# Patient Record
Sex: Male | Born: 1960 | Race: Black or African American | Hispanic: No | Marital: Single | Smoking: Current some day smoker
Health system: Southern US, Community
[De-identification: ages and names within clinical notes are randomized; demographics above are authoritative.]

## PROBLEM LIST (undated history)

## (undated) HISTORY — PX: SHOULDER SURGERY: SHX246

---

## 2005-07-16 ENCOUNTER — Emergency Department (HOSPITAL_COMMUNITY): Admission: AD | Admit: 2005-07-16 | Discharge: 2005-07-16 | Payer: Self-pay | Admitting: Family Medicine

## 2007-08-15 ENCOUNTER — Emergency Department (HOSPITAL_COMMUNITY): Admission: EM | Admit: 2007-08-15 | Discharge: 2007-08-15 | Payer: Self-pay | Admitting: Emergency Medicine

## 2008-01-18 ENCOUNTER — Emergency Department (HOSPITAL_COMMUNITY): Admission: EM | Admit: 2008-01-18 | Discharge: 2008-01-18 | Payer: Self-pay | Admitting: Emergency Medicine

## 2012-01-22 ENCOUNTER — Encounter (HOSPITAL_COMMUNITY): Payer: Self-pay | Admitting: Emergency Medicine

## 2012-01-22 ENCOUNTER — Emergency Department (HOSPITAL_COMMUNITY)
Admission: EM | Admit: 2012-01-22 | Discharge: 2012-01-22 | Disposition: A | Discharge status: Home / Self Care | Payer: Self-pay | Attending: Emergency Medicine | Admitting: Emergency Medicine

## 2012-01-22 DIAGNOSIS — M549 Dorsalgia, unspecified: Secondary | ICD-10-CM | POA: Insufficient documentation

## 2012-01-22 DIAGNOSIS — Y9389 Activity, other specified: Secondary | ICD-10-CM | POA: Insufficient documentation

## 2012-01-22 DIAGNOSIS — S39012A Strain of muscle, fascia and tendon of lower back, initial encounter: Secondary | ICD-10-CM

## 2012-01-22 DIAGNOSIS — F17200 Nicotine dependence, unspecified, uncomplicated: Secondary | ICD-10-CM | POA: Insufficient documentation

## 2012-01-22 DIAGNOSIS — S335XXA Sprain of ligaments of lumbar spine, initial encounter: Secondary | ICD-10-CM | POA: Insufficient documentation

## 2012-01-22 DIAGNOSIS — X58XXXA Exposure to other specified factors, initial encounter: Secondary | ICD-10-CM | POA: Insufficient documentation

## 2012-01-22 DIAGNOSIS — Y929 Unspecified place or not applicable: Secondary | ICD-10-CM | POA: Insufficient documentation

## 2012-01-22 LAB — URINALYSIS, ROUTINE W REFLEX MICROSCOPIC
Bilirubin Urine: NEGATIVE
Hgb urine dipstick: NEGATIVE
Nitrite: NEGATIVE
Specific Gravity, Urine: 1.02 (ref 1.005–1.030)
Urobilinogen, UA: 0.2 mg/dL (ref 0.0–1.0)
pH: 6 (ref 5.0–8.0)

## 2012-01-22 MED ORDER — KETOROLAC TROMETHAMINE 30 MG/ML IJ SOLN
30.0000 mg | Freq: Once | INTRAMUSCULAR | Status: AC
  Administered 2012-01-22: 30 mg via INTRAMUSCULAR
  Filled 2012-01-22: qty 1

## 2012-01-22 MED ORDER — IBUPROFEN 600 MG PO TABS: 600.0000 mg | ORAL_TABLET | Freq: Four times a day (QID) | ORAL | Status: DC | PRN

## 2012-01-22 MED ORDER — DIAZEPAM 10 MG PO TABS: 5.0000 mg | ORAL_TABLET | Freq: Four times a day (QID) | ORAL | Status: DC | PRN

## 2012-01-22 MED ORDER — DIAZEPAM 5 MG PO TABS
10.0000 mg | ORAL_TABLET | Freq: Once | ORAL | Status: AC
  Administered 2012-01-22: 10 mg via ORAL
  Filled 2012-01-22: qty 2

## 2012-01-22 MED ORDER — HYDROCODONE-ACETAMINOPHEN 5-325 MG PO TABS: 1.0000 | ORAL_TABLET | Freq: Four times a day (QID) | ORAL | Status: DC | PRN

## 2012-01-22 NOTE — ED Notes (Signed)
Pt c/o left sided flank pain x 3 days into lower back

## 2012-01-22 NOTE — ED Notes (Signed)
Pt. States, " brought in a bug (bed bug) dead; don't know if I got from work or not; found in bed." Inocencio Homes, NP and supporting staff mae aware.

## 2012-01-22 NOTE — ED Provider Notes (Signed)
History     CSN: [id number]  Arrival date & time 01/22/12  1732   First MD Initiated Contact with Patient 01/22/12 2054      Chief Complaint  Patient presents with  . Flank Pain    (Consider location/radiation/quality/duration/timing/severity/associated sxs/prior treatment) HPI Comments: Patient works as a Curator, for the past 3 days has had a deep pain in the L flank area without radiation that is worse with lifting, twisting, try to come to a sitting position for laying.  Has taken Ibuprofen on 2 occasion in the past 3 days with minimal relief.  Denies dysuria, diarrhea, hematuria. Previous hx of back pain/injury   Patient is a 52 y.o. male presenting with flank pain. The history is provided by the patient.  Flank Pain This is a new problem. The current episode started in the past 7 days. The problem occurs constantly. Pertinent negatives include no abdominal pain, change in bowel habit, coughing, fever, nausea, neck pain, numbness, rash or weakness. The symptoms are aggravated by bending, exertion and twisting. He has tried NSAIDs for the symptoms. The treatment provided mild relief.    History reviewed. No pertinent past medical history.  History reviewed. No pertinent past surgical history.  History reviewed. No pertinent family history.  History  Substance Use Topics  . Smoking status: Current Every Day Smoker  . Smokeless tobacco: Not on file  . Alcohol Use: No      Review of Systems  Constitutional: Positive for activity change. Negative for fever and appetite change.  HENT: Negative for neck pain.   Eyes: Negative.   Respiratory: Negative for cough.   Gastrointestinal: Negative for nausea, abdominal pain, diarrhea and change in bowel habit.  Genitourinary: Positive for flank pain. Negative for dysuria and hematuria.  Musculoskeletal: Positive for back pain.  Skin: Negative for rash and wound.  Neurological: Negative for weakness and numbness.    Psychiatric/Behavioral: Negative.     Allergies  Review of patient's allergies indicates no known allergies.  Home Medications   Current Outpatient Rx  Name  Route  Sig  Dispense  Refill  . IBUPROFEN 200 MG PO TABS   Oral   Take 600 mg by mouth every 6 (six) hours as needed. For pain         . DIAZEPAM 10 MG PO TABS   Oral   Take 0.5 tablets (5 mg total) by mouth every 6 (six) hours as needed for anxiety.   30 tablet   0   . HYDROCODONE-ACETAMINOPHEN 5-325 MG PO TABS   Oral   Take 1 tablet by mouth every 6 (six) hours as needed for pain.   10 tablet   0   . IBUPROFEN 600 MG PO TABS   Oral   Take 1 tablet (600 mg total) by mouth every 6 (six) hours as needed for pain.   30 tablet   0     BP 131/72  Pulse 76  Temp 97.6 F (36.4 C) (Oral)  Resp 13  SpO2 94%  Physical Exam  Constitutional: He is oriented to person, place, and time. He appears well-developed and well-nourished.  HENT:  Head: Normocephalic.  Eyes: Pupils are equal, round, and reactive to light.  Neck: Normal range of motion.  Cardiovascular: Normal rate.   Pulmonary/Chest: Effort normal.  Abdominal: Soft.  Musculoskeletal: He exhibits no edema and no tenderness.       Back:  Neurological: He is alert and oriented to person, place, and time.  Skin: Skin  is dry. No rash noted.    ED Course  Procedures (including critical care time)   Labs Reviewed  URINALYSIS, ROUTINE W REFLEX MICROSCOPIC   No results found.   1. Lumbar strain       MDM  urine result reviewed negative for hematuria, after PE and exam feel this is more muscular in nature than a kidney stone   Will treat with muscle relaxer and pain control         Arman Filter, NP 01/22/12 2237

## 2012-01-22 NOTE — ED Provider Notes (Signed)
Medical screening examination/treatment/procedure(s) were performed by non-physician practitioner and as supervising physician I was immediately available for consultation/collaboration.  Valjean Ruppel R. Malu Pellegrini, MD 01/22/12 2359 

## 2016-02-13 ENCOUNTER — Emergency Department (HOSPITAL_COMMUNITY): Payer: Self-pay

## 2016-02-13 ENCOUNTER — Emergency Department (HOSPITAL_COMMUNITY)
Admission: EM | Admit: 2016-02-13 | Discharge: 2016-02-13 | Disposition: A | Discharge status: Home / Self Care | Payer: Self-pay | Attending: Physician Assistant | Admitting: Physician Assistant

## 2016-02-13 ENCOUNTER — Encounter (HOSPITAL_COMMUNITY): Payer: Self-pay | Admitting: Emergency Medicine

## 2016-02-13 DIAGNOSIS — J181 Lobar pneumonia, unspecified organism: Secondary | ICD-10-CM | POA: Insufficient documentation

## 2016-02-13 DIAGNOSIS — F17200 Nicotine dependence, unspecified, uncomplicated: Secondary | ICD-10-CM | POA: Insufficient documentation

## 2016-02-13 DIAGNOSIS — J189 Pneumonia, unspecified organism: Secondary | ICD-10-CM

## 2016-02-13 DIAGNOSIS — Z79899 Other long term (current) drug therapy: Secondary | ICD-10-CM | POA: Insufficient documentation

## 2016-02-13 LAB — CBG MONITORING, ED: GLUCOSE-CAPILLARY: 131 mg/dL — AB (ref 65–99)

## 2016-02-13 MED ORDER — AZITHROMYCIN 250 MG PO TABS: 250.0000 mg | ORAL_TABLET | Freq: Every day | ORAL | 0 refills | Status: DC

## 2016-02-13 MED ORDER — ACETAMINOPHEN 325 MG PO TABS
650.0000 mg | ORAL_TABLET | Freq: Once | ORAL | Status: AC
  Administered 2016-02-13: 650 mg via ORAL
  Filled 2016-02-13: qty 2

## 2016-02-13 MED ORDER — IBUPROFEN 800 MG PO TABS
800.0000 mg | ORAL_TABLET | Freq: Once | ORAL | Status: AC
  Administered 2016-02-13: 800 mg via ORAL
  Filled 2016-02-13: qty 1

## 2016-02-13 MED ORDER — CEFTRIAXONE SODIUM 1 G IJ SOLR
1.0000 g | Freq: Once | INTRAMUSCULAR | Status: AC
  Administered 2016-02-13: 1 g via INTRAMUSCULAR
  Filled 2016-02-13: qty 10

## 2016-02-13 MED ORDER — LIDOCAINE HCL 1 % IJ SOLN
INTRAMUSCULAR | Status: AC
  Administered 2016-02-13: 2.1 mL
  Filled 2016-02-13: qty 20

## 2016-02-13 MED ORDER — AZITHROMYCIN 250 MG PO TABS
500.0000 mg | ORAL_TABLET | Freq: Once | ORAL | Status: AC
  Administered 2016-02-13: 500 mg via ORAL
  Filled 2016-02-13: qty 2

## 2016-02-13 NOTE — ED Provider Notes (Signed)
[facility]-EMERGENCY DEPT Provider Note   CSN: [id number] Arrival date & time: 02/13/16  1502     History   Chief Complaint Chief Complaint  Patient presents with  . Cough    HPI Derek Reynolds is a 56 y.o. male.  The history is provided by the patient. No language interpreter was used.  Cough  This is a new problem. The current episode started more than 2 days ago. The problem occurs constantly. The cough is productive of sputum. The maximum temperature recorded prior to his arrival was 100 to 100.9 F. Associated symptoms include shortness of breath. He has tried nothing for the symptoms. The treatment provided no relief. He is not a smoker. His past medical history does not include bronchitis.  Pt complains of a cough fever and body aches.  Pt reports he has had pneumonia in the past and that this feels the same  History reviewed. No pertinent past medical history.  There are no active problems to display for this patient.   History reviewed. No pertinent surgical history.     Home Medications    Prior to Admission medications   Medication Sig Start Date End Date Taking? Authorizing Provider  azithromycin (ZITHROMAX) 250 MG tablet Take 1 tablet (250 mg total) by mouth daily. Take first 2 tablets together, then 1 every day until finished. 02/13/16   Elson AreasLeslie K Noella Kipnis, PA-C  diazepam (VALIUM) 10 MG tablet Take 0.5 tablets (5 mg total) by mouth every 6 (six) hours as needed for anxiety. 01/22/12   Earley FavorGail Schulz, NP  HYDROcodone-acetaminophen (NORCO/VICODIN) 5-325 MG per tablet Take 1 tablet by mouth every 6 (six) hours as needed for pain. 01/22/12   Earley FavorGail Schulz, NP  ibuprofen (ADVIL,MOTRIN) 200 MG tablet Take 600 mg by mouth every 6 (six) hours as needed. For pain    Historical Provider, MD  ibuprofen (ADVIL,MOTRIN) 600 MG tablet Take 1 tablet (600 mg total) by mouth every 6 (six) hours as needed for pain. 01/22/12   Earley FavorGail Schulz, NP    Family History No family history on file.  Social  History Social History  Substance Use Topics  . Smoking status: Current Some Day Smoker  . Smokeless tobacco: Not on file  . Alcohol use No     Allergies   Patient has no known allergies.   Review of Systems Review of Systems  Respiratory: Positive for cough and shortness of breath.   All other systems reviewed and are negative.    Physical Exam Updated Vital Signs BP 134/71 (BP Location: Left Arm)   Pulse 109   Temp 98.3 F (36.8 C) (Oral)   Resp 24   Ht 5\' 8"  (1.727 m)   Wt 81.6 kg   SpO2 98%   BMI 27.37 kg/m   Physical Exam  Constitutional: He appears well-developed and well-nourished.  HENT:  Head: Normocephalic and atraumatic.  Right Ear: External ear normal.  Left Ear: External ear normal.  Nose: Nose normal.  Mouth/Throat: Oropharynx is clear and moist.  Eyes: Conjunctivae are normal.  Neck: Neck supple.  Cardiovascular: Normal rate and regular rhythm.   No murmur heard. Pulmonary/Chest: Effort normal and breath sounds normal. No respiratory distress.  Abdominal: Soft. There is no tenderness.  Musculoskeletal: He exhibits no edema.  Neurological: He is alert.  Skin: Skin is warm and dry.  Psychiatric: He has a normal mood and affect.  Nursing note and vitals reviewed.    ED Treatments / Results  Labs (all labs ordered are listed,  but only abnormal results are displayed) Labs Reviewed - No data to display  EKG  EKG Interpretation None       Radiology Dg Chest 2 View  Result Date: 02/13/2016 CLINICAL DATA:  Cough, headache and fever. EXAM: CHEST  2 VIEW COMPARISON:  None. FINDINGS: There is developing consolidation within the left upper lobe. The remainder of the lungs is clear. There is no pleural effusion or pneumothorax. Cardiomediastinal contours are normal. IMPRESSION: Left upper lobe pneumonia. Electronically Signed   By: Deatra Robinson M.D.   On: 02/13/2016 18:24    Procedures Procedures (including critical care time)  An After  Visit Summary was printed and given to the patient.Medications Ordered in ED Medications  cefTRIAXone (ROCEPHIN) injection 1 g (not administered)  azithromycin (ZITHROMAX) tablet 500 mg (not administered)  ibuprofen (ADVIL,MOTRIN) tablet 800 mg (800 mg Oral Given 02/13/16 1817)     Initial Impression / Assessment and Plan / ED Course  I have reviewed the triage vital signs and the nursing notes.  Pertinent labs & imaging results that were available during my care of the patient were reviewed by me and considered in my medical decision making (see chart for details).     Chest xray shows pneumonia,  Pt given Rocephin and zithromax here.  Pt counseled on pneumonia.  Pt does not have a primary.  Pt advised to call to schedule complete Pe and recheck of pneumonia.  Final Clinical Impressions(s) / ED Diagnoses   Final diagnoses:  Community acquired pneumonia of left upper lobe of lung (HCC)    New Prescriptions New Prescriptions   AZITHROMYCIN (ZITHROMAX) 250 MG TABLET    Take 1 tablet (250 mg total) by mouth daily. Take first 2 tablets together, then 1 every day until finished.     Lonia Skinner Bartow, PA-C 02/13/16 2000    Courteney Randall An, MD 02/17/16 [phone]

## 2016-02-13 NOTE — ED Triage Notes (Signed)
  CBG 131  

## 2016-02-13 NOTE — ED Triage Notes (Signed)
Pt from home with complaints of cough that began on Sunday with accompanying headache. Pt states he has had a fever of 102 at home, but he is not febrile here. Pt was taking mucinex (last dose was around 1100) and theraflu with minimal relief. Pt states he is lethargic and gets winded after walking. Pt states he has been coughing up thick dark colored sputum.

## 2016-02-13 NOTE — Discharge Instructions (Signed)
See your Physician for recheck next week  °

## 2016-02-14 ENCOUNTER — Emergency Department (HOSPITAL_COMMUNITY): Payer: Self-pay

## 2016-02-14 ENCOUNTER — Encounter (HOSPITAL_COMMUNITY): Payer: Self-pay | Admitting: Emergency Medicine

## 2016-02-14 ENCOUNTER — Emergency Department (HOSPITAL_COMMUNITY)
Admission: EM | Admit: 2016-02-14 | Discharge: 2016-02-14 | Disposition: A | Discharge status: Home / Self Care | Payer: Self-pay | Attending: Emergency Medicine | Admitting: Emergency Medicine

## 2016-02-14 DIAGNOSIS — J181 Lobar pneumonia, unspecified organism: Secondary | ICD-10-CM | POA: Insufficient documentation

## 2016-02-14 DIAGNOSIS — F17200 Nicotine dependence, unspecified, uncomplicated: Secondary | ICD-10-CM | POA: Insufficient documentation

## 2016-02-14 DIAGNOSIS — Z720 Tobacco use: Secondary | ICD-10-CM

## 2016-02-14 DIAGNOSIS — Z79899 Other long term (current) drug therapy: Secondary | ICD-10-CM | POA: Insufficient documentation

## 2016-02-14 DIAGNOSIS — J189 Pneumonia, unspecified organism: Secondary | ICD-10-CM

## 2016-02-14 LAB — CBC WITH DIFFERENTIAL/PLATELET
BASOS ABS: 0 10*3/uL (ref 0.0–0.1)
Basophils Relative: 0 %
Eosinophils Absolute: 0 10*3/uL (ref 0.0–0.7)
Eosinophils Relative: 0 %
HEMATOCRIT: 33.9 % — AB (ref 39.0–52.0)
HEMOGLOBIN: 11.5 g/dL — AB (ref 13.0–17.0)
LYMPHS PCT: 22 %
Lymphs Abs: 2.9 10*3/uL (ref 0.7–4.0)
MCH: 23.6 pg — ABNORMAL LOW (ref 26.0–34.0)
MCHC: 33.9 g/dL (ref 30.0–36.0)
MCV: 69.6 fL — AB (ref 78.0–100.0)
Monocytes Absolute: 1.4 10*3/uL — ABNORMAL HIGH (ref 0.1–1.0)
Monocytes Relative: 11 %
NEUTROS ABS: 8.7 10*3/uL — AB (ref 1.7–7.7)
NEUTROS PCT: 67 %
Platelets: 186 10*3/uL (ref 150–400)
RBC: 4.87 MIL/uL (ref 4.22–5.81)
RDW: 15 % (ref 11.5–15.5)
WBC: 13.1 10*3/uL — AB (ref 4.0–10.5)

## 2016-02-14 LAB — URINALYSIS, ROUTINE W REFLEX MICROSCOPIC
BACTERIA UA: NONE SEEN
BILIRUBIN URINE: NEGATIVE
Glucose, UA: NEGATIVE mg/dL
KETONES UR: NEGATIVE mg/dL
LEUKOCYTES UA: NEGATIVE
Nitrite: NEGATIVE
PH: 5 (ref 5.0–8.0)
Protein, ur: 100 mg/dL — AB
SQUAMOUS EPITHELIAL / LPF: NONE SEEN
Specific Gravity, Urine: 1.021 (ref 1.005–1.030)

## 2016-02-14 LAB — COMPREHENSIVE METABOLIC PANEL
ALBUMIN: 3.9 g/dL (ref 3.5–5.0)
ALT: 33 U/L (ref 17–63)
ANION GAP: 10 (ref 5–15)
AST: 41 U/L (ref 15–41)
Alkaline Phosphatase: 65 U/L (ref 38–126)
BILIRUBIN TOTAL: 1 mg/dL (ref 0.3–1.2)
BUN: 13 mg/dL (ref 6–20)
CO2: 28 mmol/L (ref 22–32)
Calcium: 8.6 mg/dL — ABNORMAL LOW (ref 8.9–10.3)
Chloride: 96 mmol/L — ABNORMAL LOW (ref 101–111)
Creatinine, Ser: 1.39 mg/dL — ABNORMAL HIGH (ref 0.61–1.24)
GFR, EST NON AFRICAN AMERICAN: 55 mL/min — AB (ref >60)
GLUCOSE: 113 mg/dL — AB (ref 65–99)
POTASSIUM: 2.9 mmol/L — AB (ref 3.5–5.1)
Sodium: 134 mmol/L — ABNORMAL LOW (ref 135–145)
TOTAL PROTEIN: 8.5 g/dL — AB (ref 6.5–8.1)

## 2016-02-14 LAB — I-STAT CG4 LACTIC ACID, ED: Lactic Acid, Venous: 1.92 mmol/L (ref 0.5–1.9)

## 2016-02-14 MED ORDER — DEXTROSE 5 % IV SOLN
500.0000 mg | Freq: Once | INTRAVENOUS | Status: AC
  Administered 2016-02-14: 500 mg via INTRAVENOUS
  Filled 2016-02-14: qty 500

## 2016-02-14 MED ORDER — POTASSIUM CHLORIDE CRYS ER 20 MEQ PO TBCR
40.0000 meq | EXTENDED_RELEASE_TABLET | Freq: Once | ORAL | Status: AC
  Administered 2016-02-14: 40 meq via ORAL
  Filled 2016-02-14: qty 2

## 2016-02-14 MED ORDER — SODIUM CHLORIDE 0.9 % IV BOLUS (SEPSIS)
1000.0000 mL | Freq: Once | INTRAVENOUS | Status: AC
  Administered 2016-02-14: 1000 mL via INTRAVENOUS

## 2016-02-14 MED ORDER — SODIUM CHLORIDE 0.9 % IV BOLUS (SEPSIS): 500.0000 mL | Freq: Once | INTRAVENOUS | Status: DC

## 2016-02-14 MED ORDER — ACETAMINOPHEN 325 MG PO TABS
650.0000 mg | ORAL_TABLET | Freq: Once | ORAL | Status: AC
Start: 2016-02-14 — End: 2016-02-14
  Administered 2016-02-14: 650 mg via ORAL
  Filled 2016-02-14: qty 2

## 2016-02-14 MED ORDER — SODIUM CHLORIDE 0.9 % IV BOLUS (SEPSIS): 1000.0000 mL | Freq: Once | INTRAVENOUS | Status: DC

## 2016-02-14 MED ORDER — DEXTROSE 5 % IV SOLN
1.0000 g | Freq: Once | INTRAVENOUS | Status: AC
  Administered 2016-02-14: 1 g via INTRAVENOUS
  Filled 2016-02-14: qty 10

## 2016-02-14 NOTE — ED Triage Notes (Addendum)
Patient reports was seen here for PNA yesterday and started on antibiotics.  Patient reports having fever and SOB through night but didn't take any medications for fever.  Patient reports, "I was home alone and my fever 103 and I just couldn't be home by myself like this".

## 2016-02-14 NOTE — Discharge Instructions (Signed)
Get plenty of rest and drink a lot of fluids.  For cough, use Robitussin-DM, 4 times a day.  Fever, use Tylenol, 650 mg every 4 hours.

## 2016-02-14 NOTE — ED Provider Notes (Signed)
[facility]-EMER[facility]ENCY DEPT Provider Note   CSN: [id number] Arrival date & time: 02/14/16  4098     History   Chief Complaint Chief Complaint  Patient presents with  . Pneumonia  . Shortness of Breath    HPI Derek Reynolds is a 56 y.o. male.  He complains of an illness for 5 days with fever, cough, headache and fatigue. He feels like he has "pneumonia". Was seen yesterday and started on Zithromax, and has taken the initial 500 mg dose. Is also treated with IM Rocephin yesterday. He continues to smoke. He is getting out of breath when he walks. He denies paresthesia or focal weakness. There are no other known modifying factors.   HPI  History reviewed. No pertinent past medical history.  There are no active problems to display for this patient.   History reviewed. No pertinent surgical history.     Home Medications    Prior to Admission medications   Medication Sig Start Date End Date Taking? Authorizing Provider  azithromycin (ZITHROMAX) 250 MG tablet Take 1 tablet (250 mg total) by mouth daily. Take first 2 tablets together, then 1 every day until finished. 02/13/16  Yes Lonia Skinner Sofia, PA-C  diazepam (VALIUM) 10 MG tablet Take 0.5 tablets (5 mg total) by mouth every 6 (six) hours as needed for anxiety. Patient not taking: Reported on 02/14/2016 01/22/12   Earley Favor, NP  HYDROcodone-acetaminophen (NORCO/VICODIN) 5-325 MG per tablet Take 1 tablet by mouth every 6 (six) hours as needed for pain. Patient not taking: Reported on 02/14/2016 01/22/12   Earley Favor, NP  ibuprofen (ADVIL,MOTRIN) 600 MG tablet Take 1 tablet (600 mg total) by mouth every 6 (six) hours as needed for pain. Patient not taking: Reported on 02/14/2016 01/22/12   Earley Favor, NP    Family History No family history on file.  Social History Social History  Substance Use Topics  . Smoking status: Current Some Day Smoker  . Smokeless tobacco: Not on file  . Alcohol use No     Allergies   Patient has no known  allergies.   Review of Systems Review of Systems  All other systems reviewed and are negative.    Physical Exam Updated Vital Signs BP 138/62   Pulse 88   Temp 100.2 F (37.9 C) (Oral)   Resp 15   SpO2 98%   Physical Exam  Constitutional: He is oriented to person, place, and time. He appears well-developed and well-nourished. No distress.  HENT:  Head: Normocephalic and atraumatic.  Right Ear: External ear normal.  Left Ear: External ear normal.  Eyes: Conjunctivae and EOM are normal. Pupils are equal, round, and reactive to light.  Neck: Normal range of motion and phonation normal. Neck supple.  Cardiovascular: Normal rate, regular rhythm and normal heart sounds.   Pulmonary/Chest: Effort normal and breath sounds normal. No respiratory distress. He has no wheezes. He exhibits no bony tenderness.  Abdominal: Soft. There is no tenderness.  Musculoskeletal: Normal range of motion.  Neurological: He is alert and oriented to person, place, and time. No cranial nerve deficit or sensory deficit ( ). He exhibits normal muscle tone. Coordination normal.  No dysarthria, aphasia or nystagmus.  Skin: Skin is warm, dry and intact.  Psychiatric: He has a normal mood and affect. His behavior is normal. Judgment and thought content normal.  Nursing note and vitals reviewed.    ED Treatments / Results  Labs (all labs ordered are listed, but only abnormal results are displayed) Labs  Reviewed  COMPREHENSIVE METABOLIC PANEL - Abnormal; Notable for the following:       Result Value   Sodium 134 (*)    Potassium 2.9 (*)    Chloride 96 (*)    Glucose, Bld 113 (*)    Creatinine, Ser 1.39 (*)    Calcium 8.6 (*)    Total Protein 8.5 (*)    GFR calc non Af Amer 55 (*)    All other components within normal limits  CBC WITH DIFFERENTIAL/PLATELET - Abnormal; Notable for the following:    WBC 13.1 (*)    Hemoglobin 11.5 (*)    HCT 33.9 (*)    MCV 69.6 (*)    MCH 23.6 (*)    Neutro Abs  8.7 (*)    Monocytes Absolute 1.4 (*)    All other components within normal limits  I-STAT CG4 LACTIC ACID, ED - Abnormal; Notable for the following:    Lactic Acid, Venous 1.92 (*)    All other components within normal limits  CULTURE, BLOOD (ROUTINE X 2)  CULTURE, BLOOD (ROUTINE X 2)  URINE CULTURE  URINALYSIS, ROUTINE W REFLEX MICROSCOPIC  I-STAT CG4 LACTIC ACID, ED    EKG  EKG Interpretation None       Radiology Dg Chest 2 View  Result Date: 02/14/2016 CLINICAL DATA:  Pt states his body started aching on Monday with fever and SOB. Symptoms got worse on Tuesday. Found to have fluid on left lung on Wednesday. Loss of sleep. Increase in body weakness. Head ahces. Current smoker. Coughing up blood. EXAM: CHEST  2 VIEW COMPARISON:  Chest x-ray dated 02/13/2016. FINDINGS: Ill-defined consolidation within the left upper lobe is unchanged in the short-term interval. Right lung remains clear. No pleural effusion or pneumothorax seen. Heart size is normal. Osseous structures about the chest are unremarkable. IMPRESSION: Left upper lobe airspace opacity is unchanged compared to yesterday's exam, most likely pneumonia. Pulmonary hemorrhage is a consideration given the current hemoptysis. Recommend follow-up chest x-ray to ensure resolution and exclude underlying neoplastic process. No new findings compared to yesterday's exam. Electronically Signed   By: Bary RichardStan  Maynard M.D.   On: 02/14/2016 14:51   Dg Chest 2 View  Result Date: 02/13/2016 CLINICAL DATA:  Cough, headache and fever. EXAM: CHEST  2 VIEW COMPARISON:  None. FINDINGS: There is developing consolidation within the left upper lobe. The remainder of the lungs is clear. There is no pleural effusion or pneumothorax. Cardiomediastinal contours are normal. IMPRESSION: Left upper lobe pneumonia. Electronically Signed   By: Deatra RobinsonKevin  Herman M.D.   On: 02/13/2016 18:24    Procedures Procedures (including critical care time)  Medications Ordered in  ED Medications  sodium chloride 0.9 % bolus 1,000 mL (0 mLs Intravenous Stopped 02/14/16 1751)    And  sodium chloride 0.9 % bolus 1,000 mL (not administered)    And  sodium chloride 0.9 % bolus 500 mL (not administered)  potassium chloride SA (K-DUR,KLOR-CON) CR tablet 40 mEq (not administered)  cefTRIAXone (ROCEPHIN) 1 g in dextrose 5 % 50 mL IVPB (0 g Intravenous Stopped 02/14/16 1605)  azithromycin (ZITHROMAX) 500 mg in dextrose 5 % 250 mL IVPB (500 mg Intravenous New Bag/Given 02/14/16 1607)     Initial Impression / Assessment and Plan / ED Course  I have reviewed the triage vital signs and the nursing notes.  Pertinent labs & imaging results that were available during my care of the patient were reviewed by me and considered in my medical decision making (  see chart for details).     Medications  sodium chloride 0.9 % bolus 1,000 mL (0 mLs Intravenous Stopped 02/14/16 1751)    And  sodium chloride 0.9 % bolus 1,000 mL (not administered)    And  sodium chloride 0.9 % bolus 500 mL (not administered)  potassium chloride SA (K-DUR,KLOR-CON) CR tablet 40 mEq (not administered)  cefTRIAXone (ROCEPHIN) 1 g in dextrose 5 % 50 mL IVPB (0 g Intravenous Stopped 02/14/16 1605)  azithromycin (ZITHROMAX) 500 mg in dextrose 5 % 250 mL IVPB (500 mg Intravenous New Bag/Given 02/14/16 1607)    Patient Vitals for the past 24 hrs:  BP Temp Temp src Pulse Resp SpO2  02/14/16 1745 138/62 - - 88 15 98 %  02/14/16 1730 133/62 - - 88 24 97 %  02/14/16 1715 147/86 - - 95 (!) 30 100 %  02/14/16 1700 151/78 - - 93 19 100 %  02/14/16 1645 133/86 - - 93 23 100 %  02/14/16 1630 151/80 - - 96 (!) 31 98 %  02/14/16 1615 165/78 - - 98 26 100 %  02/14/16 1545 147/74 - - 90 (!) 36 93 %  02/14/16 1203 117/69 - - 99 18 96 %  02/14/16 0854 136/82 100.2 F (37.9 C) Oral 89 26 100 %    5:52 PM Reevaluation with update and discussion. After initial assessment and treatment, an updated evaluation reveals , At this time  he is resting comfortably supine. He reaffirms that his cough is productive of sputum, and reports he has blood with it. Findings discussed with the patient and all questions were answered. Maricus Tanzi L    Final Clinical Impressions(s) / ED Diagnoses   Final diagnoses:  Community acquired pneumonia of left upper lobe of lung (HCC)  Tobacco abuse    Community-acquired pneumonia, partially treated. Doubt sepsis, metabolic instability or impending vascular collapse. A dental hypokalemia, treated in the emergency department. Is not on any potassium leaching medications, and is not vomiting. He has 4 doses of Zithromax to complete his treatment, at home currently.  Nursing Notes Reviewed/ Care Coordinated Applicable Imaging Reviewed Interpretation of Laboratory Data incorporated into ED treatment  The patient appears reasonably screened and/or stabilized for discharge and I doubt any other medical condition or other [facility] requiring further screening, evaluation, or treatment in the ED at this time prior to discharge.  Plan: Home Medications- continue; Home Treatments-stop smoking,  rest; return here if the recommended treatment, does not improve the symptoms; Recommended follow up- PCP prn  New Prescriptions New Prescriptions   No medications on file     Mancel Bale, MD 02/14/16 1755

## 2016-02-14 NOTE — ED Notes (Signed)
Ambulated to the BR with steady gait

## 2016-02-14 NOTE — ED Notes (Signed)
Pt is resting, c/o a h/a.  Pt is A&Ox 4.  No neuro deficits noted.

## 2016-02-14 NOTE — ED Notes (Addendum)
Pt placed on O2 2L [place]. Sats on RA 93%

## 2016-02-16 LAB — URINE CULTURE: CULTURE: NO GROWTH

## 2016-02-19 LAB — CULTURE, BLOOD (ROUTINE X 2)
CULTURE: NO GROWTH
Culture: NO GROWTH

## 2017-06-19 DIAGNOSIS — F17200 Nicotine dependence, unspecified, uncomplicated: Secondary | ICD-10-CM | POA: Insufficient documentation

## 2017-06-19 DIAGNOSIS — M545 Low back pain: Secondary | ICD-10-CM | POA: Insufficient documentation

## 2017-06-20 ENCOUNTER — Emergency Department (HOSPITAL_COMMUNITY)
Admission: EM | Admit: 2017-06-20 | Discharge: 2017-06-20 | Disposition: A | Discharge status: Home / Self Care | Payer: Self-pay | Attending: Emergency Medicine | Admitting: Emergency Medicine

## 2017-06-20 ENCOUNTER — Encounter (HOSPITAL_COMMUNITY): Payer: Self-pay | Admitting: Emergency Medicine

## 2017-06-20 ENCOUNTER — Other Ambulatory Visit: Payer: Self-pay

## 2017-06-20 DIAGNOSIS — M545 Low back pain, unspecified: Secondary | ICD-10-CM

## 2017-06-20 LAB — URINALYSIS, ROUTINE W REFLEX MICROSCOPIC
BILIRUBIN URINE: NEGATIVE
Glucose, UA: NEGATIVE mg/dL
HGB URINE DIPSTICK: NEGATIVE
KETONES UR: NEGATIVE mg/dL
Leukocytes, UA: NEGATIVE
Nitrite: NEGATIVE
PROTEIN: NEGATIVE mg/dL
Specific Gravity, Urine: 1.023 (ref 1.005–1.030)
pH: 5 (ref 5.0–8.0)

## 2017-06-20 MED ORDER — HYDROCODONE-ACETAMINOPHEN 5-325 MG PO TABS: 1.0000 | ORAL_TABLET | Freq: Four times a day (QID) | ORAL | 0 refills | Status: DC | PRN

## 2017-06-20 MED ORDER — CYCLOBENZAPRINE HCL 10 MG PO TABS: 10.0000 mg | ORAL_TABLET | Freq: Three times a day (TID) | ORAL | 0 refills | Status: DC | PRN

## 2017-06-20 NOTE — ED Notes (Signed)
Pt reports having lower left back pain that has been ongoing for the last 8 months but has worsened over the last several days.

## 2017-06-20 NOTE — ED Provider Notes (Signed)
[facility] Provider Note: Derek DellJ. Lane Kingstin Heims, MD, FACEP  CSN: [id number] MRN: [id number] ARRIVAL: 06/19/17 at 2356 ROOM: WA13/WA13   CHIEF COMPLAINT  Back Pain   HISTORY OF PRESENT ILLNESS  06/20/17 2:21 AM Derek Reynolds is a 57 y.o. male who was lifting heavy objects at work 4 days ago when he felt the pain in his left lower back.  Since that time he has had persistent pain in the left lower back.  It is dull and well localized.  It is fairly constant but worse with certain positions, notably bending or trying to lie in bed.  There is no radiation to the leg.  There is no associated numbness or weakness.  There is no bowel or bladder changes.  Pain is moderate to severe at its worst.  He has been taking Aleve without adequate relief.   History reviewed. No pertinent past medical history.  History reviewed. No pertinent surgical history.  History reviewed. No pertinent family history.  Social History   Tobacco Use  . Smoking status: Current Some Day Smoker  . Smokeless tobacco: Never Used  Substance Use Topics  . Alcohol use: No  . Drug use: No    Prior to Admission medications   Medication Sig Start Date End Date Taking? Authorizing Provider  azithromycin (ZITHROMAX) 250 MG tablet Take 1 tablet (250 mg total) by mouth daily. Take first 2 tablets together, then 1 every day until finished. 02/13/16   Elson AreasSofia, Leslie K, PA-C  diazepam (VALIUM) 10 MG tablet Take 0.5 tablets (5 mg total) by mouth every 6 (six) hours as needed for anxiety. Patient not taking: Reported on 02/14/2016 01/22/12   Earley FavorSchulz, Gail, NP  HYDROcodone-acetaminophen (NORCO/VICODIN) 5-325 MG per tablet Take 1 tablet by mouth every 6 (six) hours as needed for pain. Patient not taking: Reported on 02/14/2016 01/22/12   Earley FavorSchulz, Gail, NP  ibuprofen (ADVIL,MOTRIN) 600 MG tablet Take 1 tablet (600 mg total) by mouth every 6 (six) hours as needed for pain. Patient not taking: Reported on 02/14/2016 01/22/12   Earley FavorSchulz, Gail,  NP    Allergies Patient has no known allergies.   REVIEW OF SYSTEMS  Negative except as noted here or in the History of Present Illness.   PHYSICAL EXAMINATION  Initial Vital Signs Blood pressure (!) 149/88, pulse 72, temperature 98.1 F (36.7 C), temperature source Oral, resp. rate 20, height 5\' 8"  (1.727 m), weight 83.9 kg (185 lb), SpO2 98 %.  Examination General: Well-developed, well-nourished male in no acute distress; appearance consistent with age of record HENT: normocephalic; atraumatic Eyes: pupils equal, round and reactive to light; extraocular muscles intact Neck: supple Heart: regular rate and rhythm Lungs: clear to auscultation bilaterally Abdomen: soft; nondistended; nontender; bowel sounds present Back: Nontender; pain on movement of lower back; negative straight leg raise bilaterally Extremities: No deformity; full range of motion; pulses normal Neurologic: Awake, alert and oriented; motor function intact in all extremities and symmetric; sensation intact in lower extremities and symmetric; no facial droop Skin: Warm and dry Psychiatric: Normal mood and affect   RESULTS  Summary of this visit's results, reviewed by myself:   EKG Interpretation  Date/Time:    Ventricular Rate:    PR Interval:    QRS Duration:   QT Interval:    QTC Calculation:   R Axis:     Text Interpretation:        Laboratory Studies: Results for orders placed or performed during the hospital encounter of 06/20/17 (from the past  24 hour(s))  Urinalysis, Routine w reflex microscopic- may I&O cath if menses     Status: None   Collection Time: 06/20/17  1:41 AM  Result Value Ref Range   Color, Urine YELLOW YELLOW   APPearance CLEAR CLEAR   Specific Gravity, Urine 1.023 1.005 - 1.030   pH 5.0 5.0 - 8.0   Glucose, UA NEGATIVE NEGATIVE mg/dL   Hgb urine dipstick NEGATIVE NEGATIVE   Bilirubin Urine NEGATIVE NEGATIVE   Ketones, ur NEGATIVE NEGATIVE mg/dL   Protein, ur NEGATIVE  NEGATIVE mg/dL   Nitrite NEGATIVE NEGATIVE   Leukocytes, UA NEGATIVE NEGATIVE   Imaging Studies: No results found.  ED COURSE and MDM  Nursing notes and initial vitals signs, including pulse oximetry, reviewed.  Vitals:   06/20/17 0054 06/20/17 0055  BP: (!) 149/88   Pulse: 72   Resp: 20   Temp: 98.1 F (36.7 C)   TempSrc: Oral   SpO2: 98%   Weight:  83.9 kg (185 lb)  Height:  5\' 8"  (1.727 m)   There is no evidence of acute neurologic injury on physical exam.  Symptoms and examination are consistent with acute musculoskeletal pain.    PROCEDURES    ED DIAGNOSES     ICD-10-CM   1. Acute left-sided low back pain without sciatica M54.5        Doloris Servantes, Jonny Ruiz, MD 06/20/17 [phone]

## 2017-06-20 NOTE — ED Triage Notes (Signed)
Patient complaining of left mid back pain and left flank pain. Patient thinks he pulled something in his back at work on Thursday. Patient states it has been swollen on that side also and thinks that it could be a kidney stone.

## 2017-07-15 ENCOUNTER — Ambulatory Visit (HOSPITAL_COMMUNITY)
Admission: EM | Admit: 2017-07-15 | Discharge: 2017-07-15 | Disposition: A | Discharge status: Home / Self Care | Payer: PRIVATE HEALTH INSURANCE | Attending: Family Medicine | Admitting: Family Medicine

## 2017-07-15 ENCOUNTER — Other Ambulatory Visit: Payer: Self-pay

## 2017-07-15 ENCOUNTER — Encounter (HOSPITAL_COMMUNITY): Payer: Self-pay

## 2017-07-15 DIAGNOSIS — M545 Low back pain, unspecified: Secondary | ICD-10-CM

## 2017-07-15 MED ORDER — PREDNISONE 20 MG PO TABS: 40.0000 mg | ORAL_TABLET | Freq: Every day | ORAL | 0 refills | Status: AC

## 2017-07-15 MED ORDER — CYCLOBENZAPRINE HCL 5 MG PO TABS: 5.0000 mg | ORAL_TABLET | Freq: Three times a day (TID) | ORAL | 0 refills | Status: DC | PRN

## 2017-07-15 NOTE — Discharge Instructions (Signed)
5 days of prednisone. Once completed may restart aleve, twice a day, take with food, for another 7 days if needed. Flexeril as needed for spasms or tightness, may cause drowsiness. Light and regular activity. Limit lifting to <25 lbs until symptoms improve. If symptoms worsen or do not improve in the next week to return to be seen or to follow up with your PCP.

## 2017-07-15 NOTE — ED Provider Notes (Signed)
[facility]    CSN: [id number] Arrival date & time: 07/15/17  1928     History   Chief Complaint Chief Complaint  Patient presents with  . Back Pain    HPI Derek Reynolds is a 57 y.o. male.   Derek Reynolds presents with complaints of left sided mid/low back pain which started after lifting a pool table frame two days ago. Had to bend to carry it under a door way, had immediate pain following. At night the pain worsens, worsens when he wakes. Improves with activity during the day. No urinary symptoms. Pain worse with  Movements. Pain 10/10. Took aleve yesterday but has not taken any medications today. No abdominal pain. Per chart review has similar episode one month ago. Does not follow with a PCP. Without contributing medical history.     ROS per HPI.      History reviewed. No pertinent past medical history.  There are no active problems to display for this patient.   History reviewed. No pertinent surgical history.     Home Medications    Prior to Admission medications   Medication Sig Start Date End Date Taking? Authorizing Provider  cyclobenzaprine (FLEXERIL) 5 MG tablet Take 1 tablet (5 mg total) by mouth 3 (three) times daily as needed for muscle spasms. 07/15/17   Georgetta HaberBurky, Natalie B, NP  predniSONE (DELTASONE) 20 MG tablet Take 2 tablets (40 mg total) by mouth daily with breakfast for 5 days. 07/15/17 07/20/17  Georgetta HaberBurky, Natalie B, NP    Family History History reviewed. No pertinent family history.  Social History Social History   Tobacco Use  . Smoking status: Current Some Day Smoker  . Smokeless tobacco: Never Used  Substance Use Topics  . Alcohol use: No  . Drug use: No     Allergies   Patient has no known allergies.   Review of Systems Review of Systems   Physical Exam Triage Vital Signs ED Triage Vitals  Enc Vitals Group     BP 07/15/17 2012 138/82     Pulse Rate 07/15/17 2012 74     Resp 07/15/17 2012 17     Temp 07/15/17 2012 98.2  F (36.8 C)     Temp Source 07/15/17 2012 Oral     SpO2 07/15/17 2012 100 %     Weight --      Height --      Head Circumference --      Peak Flow --      Pain Score 07/15/17 2014 10     Pain Loc --      Pain Edu? --      Excl. in GC? --    No data found.  Updated Vital Signs BP 138/82 (BP Location: Left Arm)   Pulse 74   Temp 98.2 F (36.8 C) (Oral)   Resp 17   SpO2 100%   Visual Acuity Right Eye Distance:   Left Eye Distance:   Bilateral Distance:    Right Eye Near:   Left Eye Near:    Bilateral Near:     Physical Exam  Constitutional: He is oriented to person, place, and time. He appears well-developed and well-nourished.  Cardiovascular: Normal rate and regular rhythm.  Pulmonary/Chest: Effort normal and breath sounds normal.  Abdominal: There is no tenderness.  Musculoskeletal:       Lumbar back: He exhibits tenderness and pain. He exhibits no bony tenderness and no swelling.       Back:  Pain  with back flexion and extension; ambulatory, moving all extremities; sensation intact  Neurological: He is alert and oriented to person, place, and time.  Skin: Skin is warm and dry.     UC Treatments / Results  Labs (all labs ordered are listed, but only abnormal results are displayed) Labs Reviewed - No data to display  EKG None  Radiology No results found.  Procedures Procedures (including critical care time)  Medications Ordered in UC Medications - No data to display  Initial Impression / Assessment and Plan / UC Course  I have reviewed the triage vital signs and the nursing notes.  Pertinent labs & imaging results that were available during my care of the patient were reviewed by me and considered in my medical decision making (see chart for details).     History and physical consistent with muscular strain. Prednisone pack, flexeril prn. Limit lifting. Encouraged establish with a PCP for long term management if needed. Return precautions  provided. Patient verbalized understanding and agreeable to plan.  Ambulatory out of clinic without difficulty.   Final Clinical Impressions(s) / UC Diagnoses   Final diagnoses:  Acute left-sided low back pain without sciatica     Discharge Instructions     5 days of prednisone. Once completed may restart aleve, twice a day, take with food, for another 7 days if needed. Flexeril as needed for spasms or tightness, may cause drowsiness. Light and regular activity. Limit lifting to <25 lbs until symptoms improve. If symptoms worsen or do not improve in the next week to return to be seen or to follow up with your PCP.     ED Prescriptions    Medication Sig Dispense Auth. Provider   predniSONE (DELTASONE) 20 MG tablet Take 2 tablets (40 mg total) by mouth daily with breakfast for 5 days. 10 tablet Linus Mako B, NP   cyclobenzaprine (FLEXERIL) 5 MG tablet Take 1 tablet (5 mg total) by mouth 3 (three) times daily as needed for muscle spasms. 15 tablet Georgetta Haber, NP     Controlled Substance Prescriptions [place] Controlled Substance Registry consulted? Not Applicable   Georgetta Haber, NP 07/15/17 2051

## 2017-07-15 NOTE — ED Triage Notes (Signed)
[facility], pt states he was lifting at work and hurt his back

## 2017-07-27 ENCOUNTER — Emergency Department (HOSPITAL_COMMUNITY): Payer: PRIVATE HEALTH INSURANCE

## 2017-07-27 ENCOUNTER — Emergency Department (HOSPITAL_COMMUNITY)
Admission: EM | Admit: 2017-07-27 | Discharge: 2017-07-27 | Disposition: A | Discharge status: Home / Self Care | Payer: PRIVATE HEALTH INSURANCE | Attending: Emergency Medicine | Admitting: Emergency Medicine

## 2017-07-27 ENCOUNTER — Encounter (HOSPITAL_COMMUNITY): Payer: Self-pay | Admitting: Emergency Medicine

## 2017-07-27 ENCOUNTER — Other Ambulatory Visit: Payer: Self-pay

## 2017-07-27 DIAGNOSIS — M545 Low back pain, unspecified: Secondary | ICD-10-CM

## 2017-07-27 DIAGNOSIS — M549 Dorsalgia, unspecified: Secondary | ICD-10-CM

## 2017-07-27 DIAGNOSIS — F17200 Nicotine dependence, unspecified, uncomplicated: Secondary | ICD-10-CM | POA: Diagnosis not present

## 2017-07-27 DIAGNOSIS — M47816 Spondylosis without myelopathy or radiculopathy, lumbar region: Secondary | ICD-10-CM | POA: Diagnosis not present

## 2017-07-27 MED ORDER — OXYCODONE-ACETAMINOPHEN 5-325 MG PO TABS: 1.0000 | ORAL_TABLET | ORAL | 0 refills | Status: DC | PRN

## 2017-07-27 MED ORDER — PREDNISONE 20 MG PO TABS: ORAL_TABLET | ORAL | 0 refills | Status: DC

## 2017-07-27 MED ORDER — PREDNISONE 20 MG PO TABS
60.0000 mg | ORAL_TABLET | Freq: Once | ORAL | Status: AC
  Administered 2017-07-27: 60 mg via ORAL
  Filled 2017-07-27: qty 3

## 2017-07-27 MED ORDER — OXYCODONE-ACETAMINOPHEN 5-325 MG PO TABS
1.0000 | ORAL_TABLET | Freq: Once | ORAL | Status: AC
  Administered 2017-07-27: 1 via ORAL
  Filled 2017-07-27: qty 1

## 2017-07-27 NOTE — ED Provider Notes (Signed)
[facility] EMERGENCY DEPARTMENT Provider Note   CSN: [id number] Arrival date & time: 07/27/17  0425     History   Chief Complaint Chief Complaint  Patient presents with  . Back Pain    HPI Derek Reynolds is a 57 y.o. male.  The history is provided by the patient and medical records.  Back Pain      57 year old male here with left-sided back pain.  Reports this is been ongoing for about 2 months now.  States he is always had a little bit of pain in the left side of his back but it never bothered him too much.  Reports about 2 months ago he was moving a pool table at work and had to crouch down to go through a doorway and when he stood back up felt like something was "off" in his back.  He did not feel a pop or any other severe onset of pain.  Reports since then he has been having some issues with his back and worsening pain.  States pain is always left-sided along the mid and lower back.  States occasionally his left leg will feel like it "gives out".  He does not have any focal numbness or weakness of his legs.  No bowel or bladder incontinence.  His boss did pay for him to go see a chiropractor, had about 12 sessions of adjustments, cryotherapy, and TENS unit therapy without much change.  He has been seen in the ED and at urgent care twice recently for this and is tried prednisone, pain medication, muscle relaxers without much improvement.  States he had an x-ray at the chiropractor's office prior to his adjustments but was never told of the results.  States he has been researching online and is just concerned.  He denies any fever or chills.  No history of cancer or IV drug use.  Did recently stop smoking.  No chest pain or shortness of breath.  No urinary symptoms.  History reviewed. No pertinent past medical history.  There are no active problems to display for this patient.   History reviewed. No pertinent surgical history.      Home Medications    Prior  to Admission medications   Medication Sig Start Date End Date Taking? Authorizing Provider  cyclobenzaprine (FLEXERIL) 5 MG tablet Take 1 tablet (5 mg total) by mouth 3 (three) times daily as needed for muscle spasms. 07/15/17   Georgetta Haber, NP    Family History History reviewed. No pertinent family history.  Social History Social History   Tobacco Use  . Smoking status: Current Some Day Smoker  . Smokeless tobacco: Never Used  Substance Use Topics  . Alcohol use: No  . Drug use: No     Allergies   Patient has no known allergies.   Review of Systems Review of Systems  Musculoskeletal: Positive for back pain.  All other systems reviewed and are negative.    Physical Exam Updated Vital Signs BP (!) 158/93 (BP Location: Right Arm)   Pulse 76   Temp 98 F (36.7 C) (Oral)   Resp 14   Ht 5\' 8"  (1.727 m)   Wt 83.9 kg (185 lb)   SpO2 98%   BMI 28.13 kg/m   Physical Exam  Constitutional: He is oriented to person, place, and time. He appears well-developed and well-nourished.  HENT:  Head: Normocephalic and atraumatic.  Mouth/Throat: Oropharynx is clear and moist.  Eyes: Pupils are equal, round, and reactive  to light. Conjunctivae and EOM are normal.  Neck: Normal range of motion.  Cardiovascular: Normal rate, regular rhythm and normal heart sounds.  Pulmonary/Chest: Effort normal and breath sounds normal. No stridor. No respiratory distress.  Abdominal: Soft. Bowel sounds are normal. There is no tenderness. There is no rebound.  Musculoskeletal: Normal range of motion.  Thoracic and lumbar spine overall non-tender; negative SLR bilaterally; pain elicited when changing from sitting to standing position-- points to left mid-back; normal strength/sensation of both legs, normal gait  Neurological: He is alert and oriented to person, place, and time.  Skin: Skin is warm and dry.  Psychiatric: He has a normal mood and affect.  Nursing note and vitals  reviewed.    ED Treatments / Results  Labs (all labs ordered are listed, but only abnormal results are displayed) Labs Reviewed - No data to display  EKG None  Radiology Dg Thoracic Spine W/swimmers  Result Date: 07/27/2017 CLINICAL DATA:  Initial evaluation for back pain. EXAM: THORACIC SPINE - 3 VIEWS COMPARISON:  None. FINDINGS: There is no evidence of thoracic spine fracture. Trace levoscoliosis. Alignment otherwise normal with preservation of the normal thoracic kyphosis. No other significant bone abnormalities are identified. IMPRESSION: Negative. Electronically Signed   By: Rise Mu M.D.   On: 07/27/2017 05:44   Dg Lumbar Spine Complete  Result Date: 07/27/2017 CLINICAL DATA:  Initial evaluation for acute mid and lower back pain for 2 months. EXAM: LUMBAR SPINE - COMPLETE 4+ VIEW COMPARISON:  None. FINDINGS: There is no evidence of lumbar spine fracture. Alignment is normal. Intervertebral disc spaces are maintained. Mild facet hypertrophy at L4-5 and L5-S1. Visualized soft tissues within normal limits. Mild osteoarthritic changes about the hips bilaterally. IMPRESSION: 1. Mild facet hypertrophy at L4-5 and L5-S1. 2. Otherwise negative radiographs of the lumbar spine. Electronically Signed   By: Rise Mu M.D.   On: 07/27/2017 05:42    Procedures Procedures (including critical care time)  Medications Ordered in ED Medications - No data to display   Initial Impression / Assessment and Plan / ED Course  I have reviewed the triage vital signs and the nursing notes.  Pertinent labs & imaging results that were available during my care of the patient were reviewed by me and considered in my medical decision making (see chart for details).  57 year old male here with 2 months of left-sided mid and lower back pain.  States he is always had a little bit of issues with his back, worse after moving a pool table 2 months ago at work.  He is afebrile and nontoxic.   No focal tenderness or deformities of the thoracic or lumbar spine, but pain is elicited when changing from sitting to standing position.  Negative straight leg raise bilaterally.  Has had some adjustments by chiropractor for the past 2 months.  Will obtain screening x-rays.  X-rays with facet hypertrophy.  This may be causing some of his symptoms with subsequent nerve impingement.  He remains without any focal neurologic deficits concerning for cauda equina.  He has no red flag symptoms such as fever, history of cancer, or IV drug use.  Will treat symptomatically and refer to orthopedics for ongoing management.  Patient is currently on light duty, will continue this at work.  He understands to wear back brace to help if doing any heavy lifting.  Discussed plan with patient, he acknowledged understanding and agreed with plan of care.  Return precautions given for new or worsening symptoms.  Final  Clinical Impressions(s) / ED Diagnoses   Final diagnoses:  Back pain    ED Discharge Orders        Ordered    oxyCODONE-acetaminophen (PERCOCET) 5-325 MG tablet  Every 4 hours PRN     07/27/17 0627    predniSONE (DELTASONE) 20 MG tablet     07/27/17 0627       Garlon HatchetSanders, Donzella Carrol M, PA-C 07/27/17 [id number]    Azalia Bilisampos, Kevin, MD 07/27/17 2321

## 2017-07-27 NOTE — ED Notes (Signed)
Patient transported to X-ray 

## 2017-07-27 NOTE — ED Notes (Signed)
ED Provider at bedside. 

## 2017-07-27 NOTE — ED Triage Notes (Signed)
Pt reports ongoing thoracic back pain, seen earlier this month for same. Wants XRAY or MRI. Denies tingling, numbness, loss of bowel or bladder. States RX meds not effective for pain.

## 2017-07-27 NOTE — Discharge Instructions (Signed)
Take the prescribed medication as directed.  Can try adding in heat to your routine.  Can alternate heat, off, ice, off every 20 mins or so.   Can try sleeping on your side with pillow between your legs like we talked about to see if this can take some stress off your low back. Follow-up with orthopedics-- call for appt. Return to the ED for new or worsening symptoms.

## 2017-08-10 ENCOUNTER — Other Ambulatory Visit: Payer: Self-pay | Admitting: Orthopedic Surgery

## 2017-08-10 ENCOUNTER — Other Ambulatory Visit (HOSPITAL_COMMUNITY): Payer: Self-pay | Admitting: Orthopedic Surgery

## 2017-08-10 DIAGNOSIS — M545 Low back pain: Secondary | ICD-10-CM

## 2017-08-12 ENCOUNTER — Ambulatory Visit (HOSPITAL_COMMUNITY): Admission: RE | Admit: 2017-08-12 | Payer: PRIVATE HEALTH INSURANCE | Source: Ambulatory Visit

## 2017-08-21 ENCOUNTER — Ambulatory Visit (HOSPITAL_COMMUNITY)
Admission: RE | Admit: 2017-08-21 | Discharge: 2017-08-21 | Disposition: A | Discharge status: Home / Self Care | Payer: PRIVATE HEALTH INSURANCE | Source: Ambulatory Visit | Attending: Diagnostic Radiology | Admitting: Diagnostic Radiology

## 2017-08-21 ENCOUNTER — Other Ambulatory Visit (HOSPITAL_COMMUNITY): Payer: Self-pay | Admitting: Orthopedic Surgery

## 2017-08-21 ENCOUNTER — Ambulatory Visit (HOSPITAL_COMMUNITY)
Admission: RE | Admit: 2017-08-21 | Discharge: 2017-08-21 | Disposition: A | Discharge status: Home / Self Care | Payer: PRIVATE HEALTH INSURANCE | Source: Ambulatory Visit | Attending: Orthopedic Surgery | Admitting: Orthopedic Surgery

## 2017-08-21 DIAGNOSIS — M545 Low back pain: Secondary | ICD-10-CM

## 2017-08-21 DIAGNOSIS — M5127 Other intervertebral disc displacement, lumbosacral region: Secondary | ICD-10-CM | POA: Insufficient documentation

## 2017-08-21 DIAGNOSIS — M5136 Other intervertebral disc degeneration, lumbar region: Secondary | ICD-10-CM | POA: Insufficient documentation

## 2017-08-21 DIAGNOSIS — Z1389 Encounter for screening for other disorder: Secondary | ICD-10-CM | POA: Insufficient documentation

## 2019-01-08 IMAGING — MR MR LUMBAR SPINE W/O CM
4 of 5 series · 19 of 48 positions shown · non-contrast
Comparison: Lumbar radiographs 07/27/2017

CLINICAL DATA: Low back pain

EXAM:
MRI LUMBAR SPINE WITHOUT CONTRAST
TECHNIQUE: Multiplanar, multisequence MR imaging of the lumbar spine was
performed. No intravenous contrast was administered.

[Series 3: T1 · sagittal · 4.0mm · 0.51mm/px · 3 of 14 slices shown (1 of 2)]
[im 1/14]
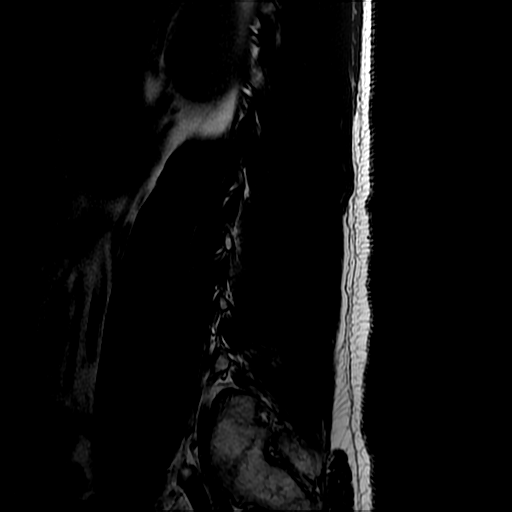
[im 7/14]
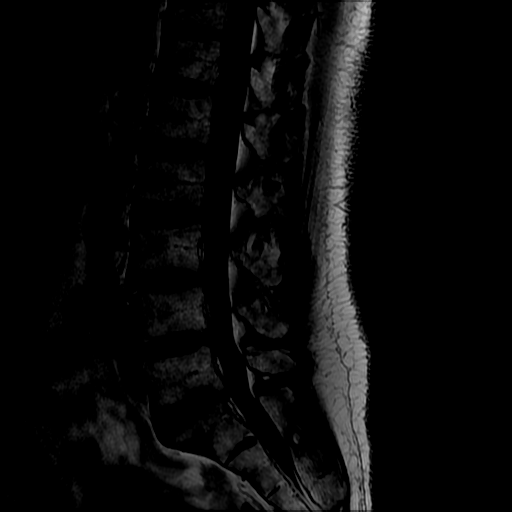
[im 14/14]
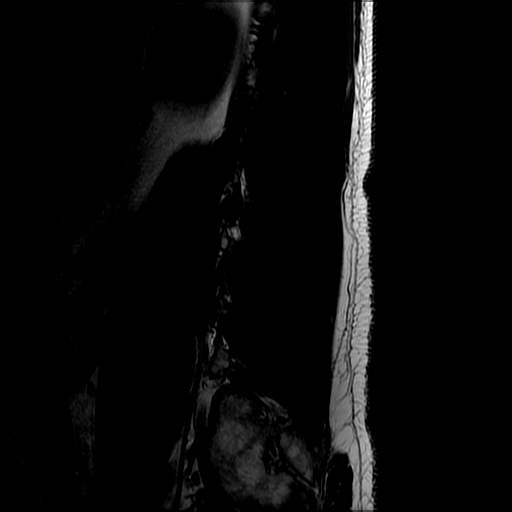

[Series 4: T2 post-contrast · sagittal · 4.0mm · 0.51mm/px · 5 of 14 slices shown]
[im 1/14]
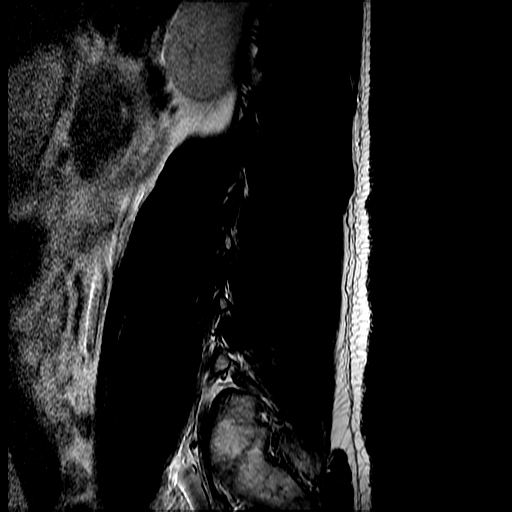
[im 4/14]
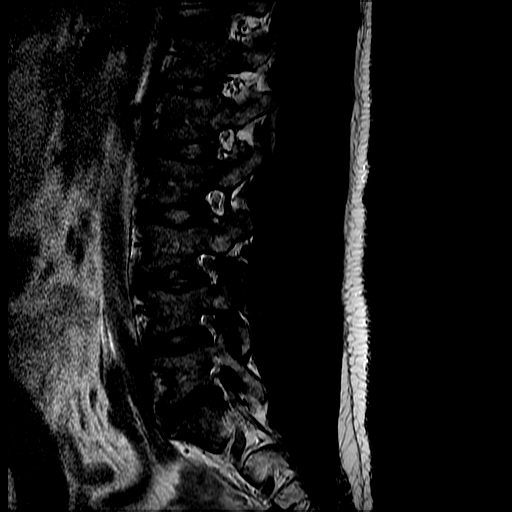
[im 7/14]
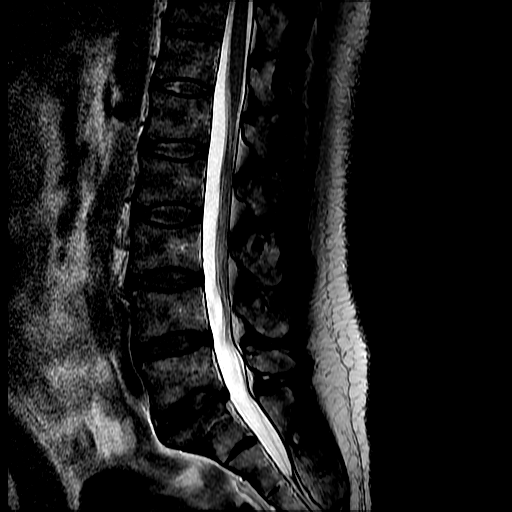
[im 10/14]
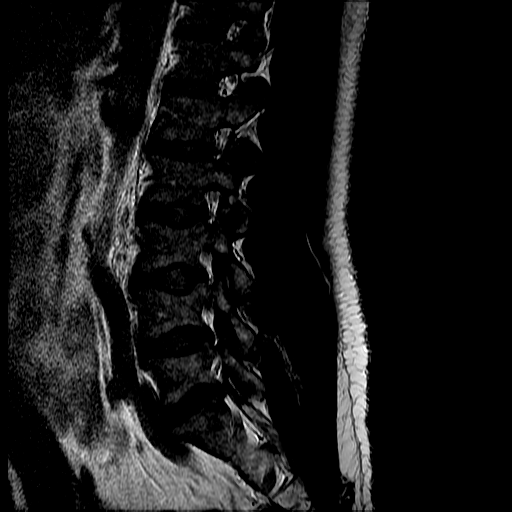
[im 14/14]
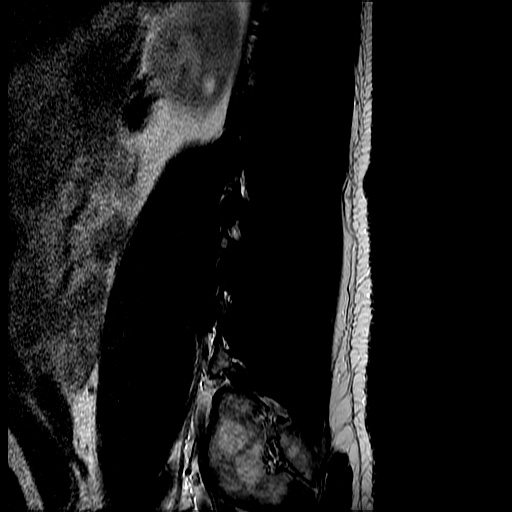

[Series 6: T2 · axial · 5.0mm · 0.39mm/px · z∈[-78,+77]mm · 8 of 40 slices shown]
[im 3/40]
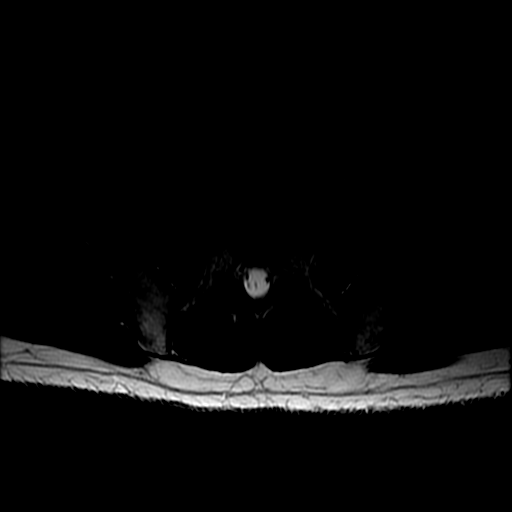
[im 6/40]
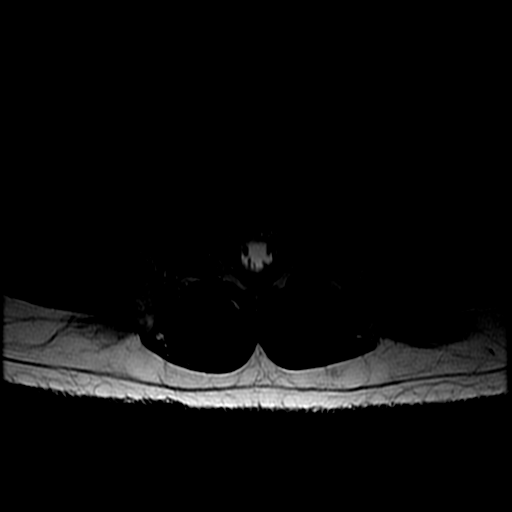
[im 8/40]
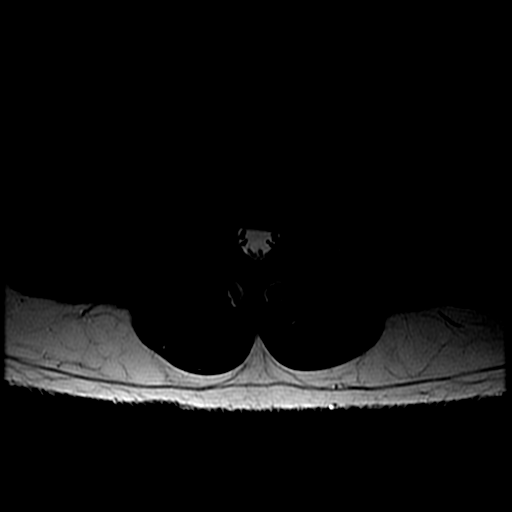
[im 14/40]
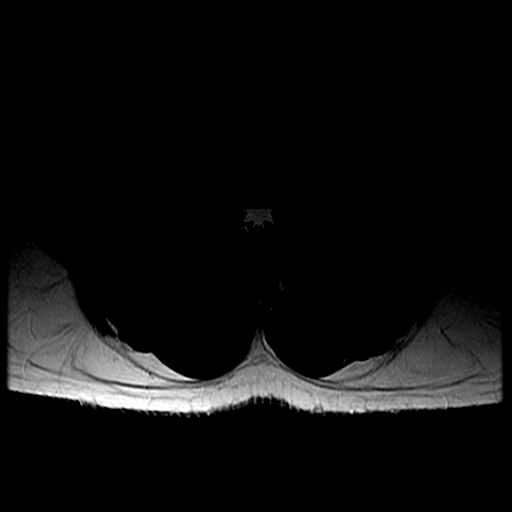
[im 19/40]
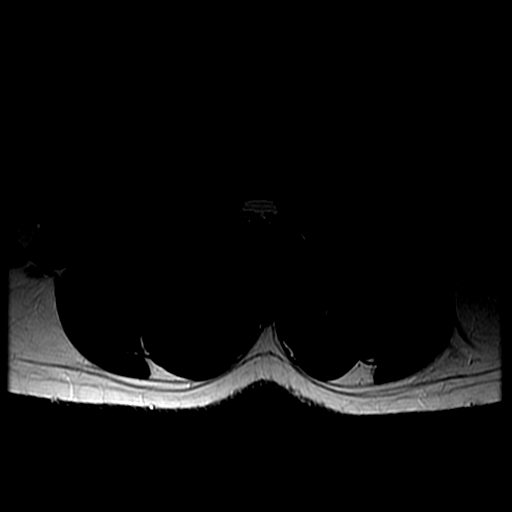
[im 21/40]
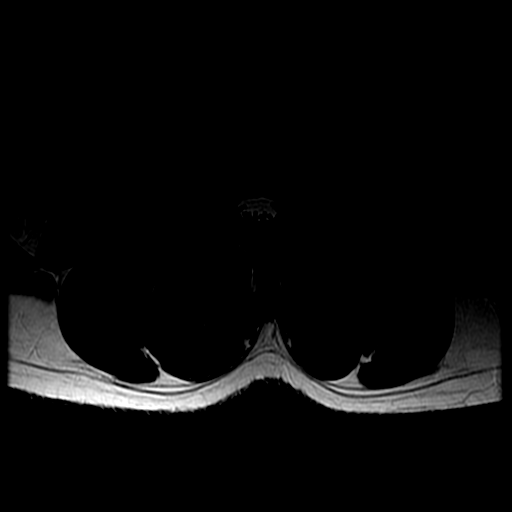
[im 24/40]
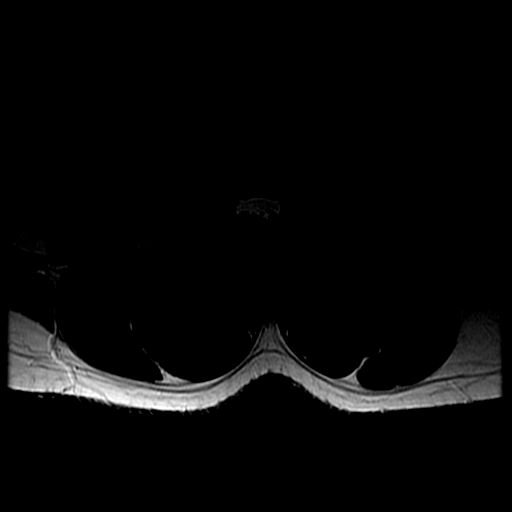
[im 34/40]
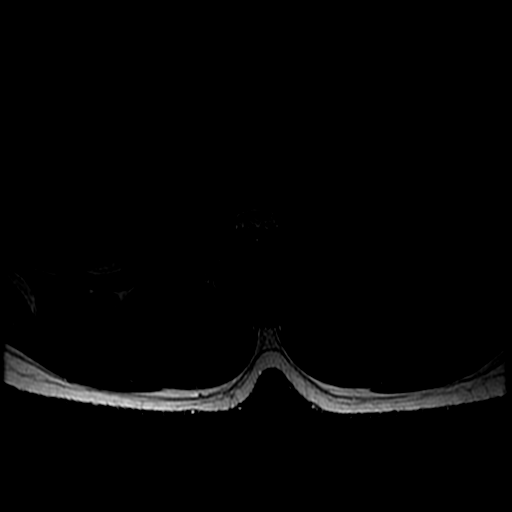

[Series 7: T1 · axial · 5.0mm · 0.39mm/px · z∈[-63,+77]mm · 3 of 40 slices shown (2 of 2)]
[im 6/40]
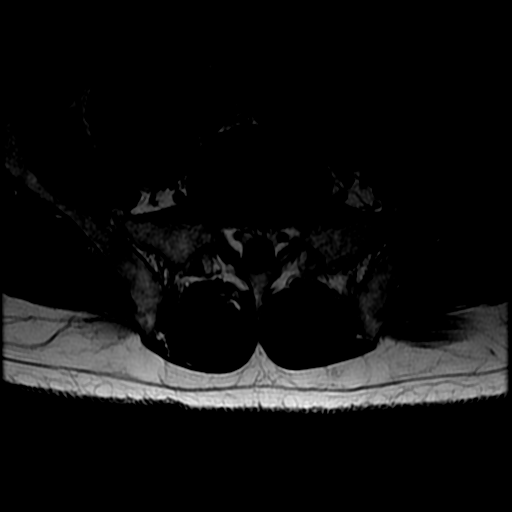
[im 21/40]
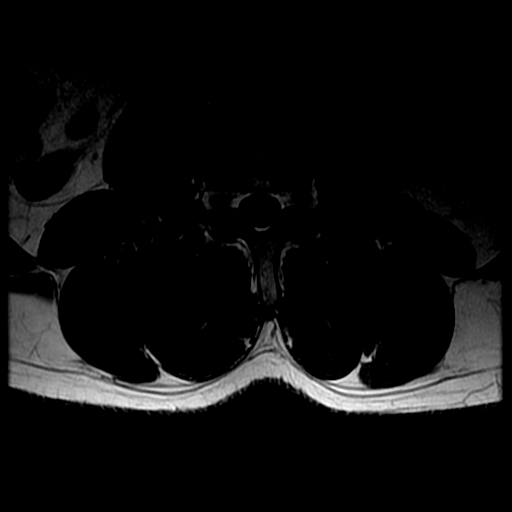
[im 34/40]
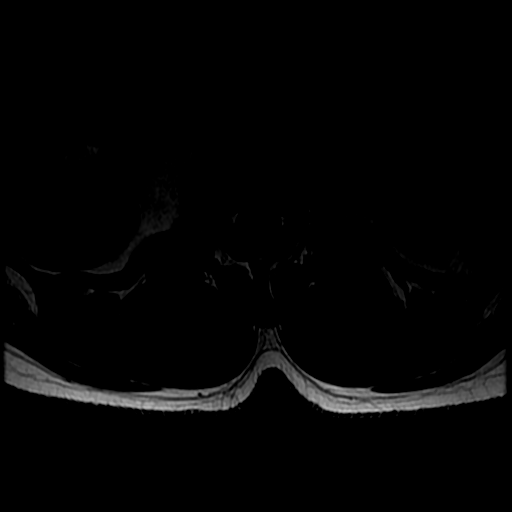

[19 of 48 positions shown; findings below may reference images not displayed]

FINDINGS: Segmentation:  Normal

Alignment:  Normal

Vertebrae:  Normal bone marrow.  Negative for fracture or mass.

Conus medullaris and cauda equina: Conus extends to the L1-2 level.
Conus and cauda equina appear normal.

Paraspinal and other soft tissues: Normal paraspinous soft tissues.

Disc levels:

L1-2: Negative

L2-3: Negative

L3-4: Small annular fissure lateral to the foramen on the left
without disc protrusion or stenosis.

L4-5: Mild disc and facet degeneration without significant stenosis.

L5-S1: Small annular fissure posteriorly on the left with small disc
protrusion. This is touching but not compressing the left S1 nerve
root. Mild facet hypertrophy and mild subarticular stenosis
bilaterally.
IMPRESSION: Small annular fissure lateral to the foramen on the left at L3-4
without significant disc protrusion or stenosis

Mild disc and facet degeneration L4-5

Small left-sided disc protrusion and annular fissure at L5-S1 which
could affect the left S1 nerve root.

## 2019-11-02 ENCOUNTER — Emergency Department (HOSPITAL_COMMUNITY)
Admission: EM | Admit: 2019-11-02 | Discharge: 2019-11-02 | Disposition: A | Discharge status: Home / Self Care | Payer: Worker's Compensation | Attending: Emergency Medicine | Admitting: Emergency Medicine

## 2019-11-02 ENCOUNTER — Emergency Department (HOSPITAL_COMMUNITY): Payer: Worker's Compensation

## 2019-11-02 ENCOUNTER — Encounter (HOSPITAL_COMMUNITY): Payer: Self-pay

## 2019-11-02 DIAGNOSIS — S4991XA Unspecified injury of right shoulder and upper arm, initial encounter: Secondary | ICD-10-CM | POA: Insufficient documentation

## 2019-11-02 DIAGNOSIS — W208XXA Other cause of strike by thrown, projected or falling object, initial encounter: Secondary | ICD-10-CM | POA: Diagnosis not present

## 2019-11-02 DIAGNOSIS — Y92810 Car as the place of occurrence of the external cause: Secondary | ICD-10-CM | POA: Insufficient documentation

## 2019-11-02 DIAGNOSIS — F17200 Nicotine dependence, unspecified, uncomplicated: Secondary | ICD-10-CM | POA: Diagnosis not present

## 2019-11-02 DIAGNOSIS — Y9389 Activity, other specified: Secondary | ICD-10-CM | POA: Insufficient documentation

## 2019-11-02 MED ORDER — METHOCARBAMOL 500 MG PO TABS: 500.0000 mg | ORAL_TABLET | Freq: Three times a day (TID) | ORAL | 0 refills | Status: DC | PRN

## 2019-11-02 MED ORDER — NAPROXEN 500 MG PO TABS
500.0000 mg | ORAL_TABLET | Freq: Once | ORAL | Status: AC
  Administered 2019-11-02: 500 mg via ORAL
  Filled 2019-11-02: qty 1

## 2019-11-02 MED ORDER — NAPROXEN 500 MG PO TABS: 500.0000 mg | ORAL_TABLET | Freq: Two times a day (BID) | ORAL | 0 refills | Status: AC | PRN

## 2019-11-02 NOTE — ED Triage Notes (Signed)
Pt states he was changing a tire and the car fell on his right shoulder and it knocked his elbow in the ground, pt states that he can't bend his right elbow and it appears to be swollen, no obvious deformity on his right shoulder but he states that its sore

## 2019-11-02 NOTE — Discharge Instructions (Addendum)
Please read and follow all provided instructions.  You have been seen today for right elbow and shoulder pain following an injury.  Tests performed today include: An x-ray of the affected areas - does NOT show any broken bones or dislocations.  Vital signs. See below for your results today.   Home care instructions: -- *PRICE in the first 24-48 hours after injury: Protect (with brace, splint, sling), if given by your provider Rest Ice- Do not apply ice pack directly to your skin, place towel or similar between your skin and ice/ice pack. Apply ice for 20 min, then remove for 40 min while awake Compression- Wear brace, elastic bandage, splint as directed by your provider Elevate affected extremity above the level of your heart when not walking around for the first 24-48 hours   With your sling please be sure to still move your shoulder some to avoid frozen shoulder syndrome. Medications:  - Naproxen is a nonsteroidal anti-inflammatory medication that will help with pain and swelling. Be sure to take this medication as prescribed with food, 1 pill every 12 hours,  It should be taken with food, as it can cause stomach upset, and more seriously, stomach bleeding. Do not take other nonsteroidal anti-inflammatory medications with this such as Advil, Motrin, Aleve, Mobic, Goodie Powder, or Motrin.    - Robaxin is the muscle relaxer I have prescribed, this is meant to help with muscle tightness. Be aware that this medication may make you drowsy therefore the first time you take this it should be at a time you are in an environment where you can rest. Do not drive or operate heavy machinery when taking this medication. Do not drink alcohol or take other sedating medications with this medicine such as narcotics or benzodiazepines.   You make take Tylenol per over the counter dosing with these medications.   We have prescribed you new medication(s) today. Discuss the medications prescribed today with  your pharmacist as they can have adverse effects and interactions with your other medicines including over the counter and prescribed medications. Seek medical evaluation if you start to experience new or abnormal symptoms after taking one of these medicines, seek care immediately if you start to experience difficulty breathing, feeling of your throat closing, facial swelling, or rash as these could be indications of a more serious allergic reaction   Follow-up instructions: Please follow-up with your primary care provider or the provided orthopedic physician (bone specialist) if you continue to have significant pain in 1 week. In this case you may have a more severe injury that requires further care.   Return instructions:  Please return if your digits or extremity are numb or tingling, appear gray or blue, or you have severe pain (also elevate the extremity and loosen splint or wrap if you were given one) Please return if you have redness or fevers.  Please return to the Emergency Department if you experience worsening symptoms.  Please return if you have any other emergent concerns. Additional Information:  Your vital signs today were: BP (!) 158/83 (BP Location: Left Arm)   Pulse 83   Temp 98.1 F (36.7 C) (Oral)   Resp 18   Ht 5\' 8"  (1.727 m)   Wt 83.9 kg   SpO2 99%   BMI 28.13 kg/m  If your blood pressure (BP) was elevated above 135/85 this visit, please have this repeated by your doctor within one month. ---------------

## 2019-11-02 NOTE — ED Provider Notes (Signed)
[facility]-EMERGENCY DEPT Provider Note   CSN: [id number] Arrival date & time: 11/02/19  0017     History Chief Complaint  Patient presents with  . Arm Injury    Derek Reynolds. is a 59 y.o. male with a hx of tobacco abuse who presents to the ED with complaints of injury to the RUE which occurred shortly PTA this evening. Patient states he was changing a tire and someone else was maneuvering the car jack, the carjack accidentally collapsed. The patient tried to move but his shoulder was hit by the car and he then fell onto his R elbow. He denies head injury or LOC. Having pain only to R shoulder with movement and to elbow. No other alleviating/aggravating factors. Denies numbness, tingling, weakness, or other areas of injury. Patient is R hand dominant.   HPI     History reviewed. No pertinent past medical history.  There are no problems to display for this patient.   History reviewed. No pertinent surgical history.     History reviewed. No pertinent family history.  Social History   Tobacco Use  . Smoking status: Current Some Day Smoker  . Smokeless tobacco: Never Used  Vaping Use  . Vaping Use: Never used  Substance Use Topics  . Alcohol use: No  . Drug use: No    Home Medications Prior to Admission medications   Medication Sig Start Date End Date Taking? Authorizing Provider  cyclobenzaprine (FLEXERIL) 5 MG tablet Take 1 tablet (5 mg total) by mouth 3 (three) times daily as needed for muscle spasms. Patient not taking: Reported on 11/02/2019 07/15/17   Linus Mako B, NP  oxyCODONE-acetaminophen (PERCOCET) 5-325 MG tablet Take 1 tablet by mouth every 4 (four) hours as needed. Patient not taking: Reported on 11/02/2019 07/27/17   Garlon Hatchet, PA-C  predniSONE (DELTASONE) 20 MG tablet Take 40 mg by mouth daily for 3 days, then 20mg  by mouth daily for 3 days, then 10mg  daily for 3 days Patient not taking: Reported on 11/02/2019  07/27/17   11/04/2019, PA-C    Allergies    Patient has no known allergies.  Review of Systems   Review of Systems  Constitutional: Negative for chills and fever.  Respiratory: Negative for shortness of breath.   Cardiovascular: Negative for chest pain.  Gastrointestinal: Negative for abdominal pain.  Musculoskeletal: Positive for arthralgias.  Skin: Negative for wound.  Neurological: Negative for weakness and numbness.  All other systems reviewed and are negative.   Physical Exam Updated Vital Signs BP (!) 158/83 (BP Location: Left Arm)   Pulse 83   Temp 98.1 F (36.7 C) (Oral)   Resp 18   Ht 5\' 8"  (1.727 m)   Wt 83.9 kg   SpO2 99%   BMI 28.13 kg/m   Physical Exam Vitals and nursing note reviewed.  Constitutional:      General: He is not in acute distress.    Appearance: Normal appearance. He is not ill-appearing or toxic-appearing.  HENT:     Head: Normocephalic and atraumatic.  Neck:     Comments: No midline tenderness.  Cardiovascular:     Rate and Rhythm: Normal rate and regular rhythm.     Pulses:          Radial pulses are 2+ on the right side and 2+ on the left side.  Pulmonary:     Effort: Pulmonary effort is normal. No respiratory distress.     Breath sounds:  Normal breath sounds.  Chest:     Chest wall: No tenderness.  Musculoskeletal:     Cervical back: Normal range of motion and neck supple.     Comments: Upper extremities: No significant open wounds.  Patient has some swelling to the right posterior elbow.  He has intact active range of motion throughout the left upper extremity as well as to the wrist and digits in the right upper extremity.  He is able to fully pronate and supinate bilaterally.  He is able to fully extend the right elbow but does have some mild limitation in flexion, able to flex past 90 degrees.  Right shoulder with limitation in flexion and abduction, able to get to about 90 degrees.  Patient is tender to palpation to the  anterior glenohumeral joint and the posterior elbow of the right upper extremity.  Upper extremities are otherwise nontender.  There is no anatomical snuffbox tenderness.   No midline spinal tenderness  Skin:    General: Skin is warm and dry.     Capillary Refill: Capillary refill takes less than 2 seconds.  Neurological:     Mental Status: He is alert.     Comments: Alert. Clear speech. Sensation grossly intact to bilateral upper extremities. 5/5 symmetric grip strength.  Able to perform okay sign, thumbs up, cross second/third digits bilaterally.  Ambulatory.   Psychiatric:        Mood and Affect: Mood normal.        Behavior: Behavior normal.     ED Results / Procedures / Treatments   Labs (all labs ordered are listed, but only abnormal results are displayed) Labs Reviewed - No data to display  EKG None  Radiology DG Shoulder Right  Result Date: 11/02/2019 CLINICAL DATA:  Crush injury EXAM: RIGHT SHOULDER - 2+ VIEW COMPARISON:  None. FINDINGS: There is no evidence of fracture or dislocation. There is no evidence of arthropathy or other focal bone abnormality. Soft tissues are unremarkable. IMPRESSION: Negative. Electronically Signed   By: Deatra Robinson M.D.   On: 11/02/2019 01:13   DG Elbow Complete Right  Result Date: 11/02/2019 CLINICAL DATA:  Crush injury EXAM: RIGHT ELBOW - COMPLETE 3+ VIEW COMPARISON:  None. FINDINGS: There is no evidence of fracture, dislocation, or joint effusion. There is no evidence of arthropathy or other focal bone abnormality. Soft tissue swelling along the dorsum of the elbow. IMPRESSION: Negative. Electronically Signed   By: Deatra Robinson M.D.   On: 11/02/2019 01:18    Procedures Procedures (including critical care time)  Medications Ordered in ED Medications  naproxen (NAPROSYN) tablet 500 mg (has no administration in time range)    ED Course  I have reviewed the triage vital signs and the nursing notes.  Pertinent labs & imaging  results that were available during my care of the patient were reviewed by me and considered in my medical decision making (see chart for details).    MDM Rules/Calculators/A&P                          Patient presents to the emergency department status post right upper extremity injury.  He is nontoxic, resting comfortably, his vitals notable for mildly elevated BP, doubt hypertensive emergency.  He has no significant open wounds on exam.  No signs of infection.  He has mild limitation in right shoulder flexion and abduction as well as right elbow flexion.  X-rays of the right elbow and shoulder were  ordered per triage protocol, I personally reviewed and interpreted imaging, no appreciable fractures or dislocations, agree with radiologist read.  Patient is neurovascularly intact distally.  Possibility of ligamentous injury or tendon injury.  We will place patient in sling and provide naproxen and Robaxin for pain with orthopedics follow-up.  We discussed maintaining some motion throughout the shoulder to avoid adhesive capsulitis. I discussed results, treatment plan, need for follow-up, and return precautions with the patient. Provided opportunity for questions, patient confirmed understanding and is in agreement with plan.   Final Clinical Impression(s) / ED Diagnoses Final diagnoses:  Injury of right upper extremity, initial encounter    Rx / DC Orders ED Discharge Orders         Ordered    naproxen (NAPROSYN) 500 MG tablet  2 times daily PRN        11/02/19 0148    methocarbamol (ROBAXIN) 500 MG tablet  Every 8 hours PRN        11/02/19 0148           Buck Mcaffee, Pleas Koch, PA-C 11/02/19 0148    Dione Booze, MD 11/02/19 [phone]

## 2019-11-03 ENCOUNTER — Emergency Department (HOSPITAL_COMMUNITY)
Admission: EM | Admit: 2019-11-03 | Discharge: 2019-11-03 | Disposition: A | Discharge status: Home / Self Care | Payer: No Typology Code available for payment source | Attending: Emergency Medicine | Admitting: Emergency Medicine

## 2019-11-03 DIAGNOSIS — M79601 Pain in right arm: Secondary | ICD-10-CM | POA: Insufficient documentation

## 2019-11-03 DIAGNOSIS — S4991XD Unspecified injury of right shoulder and upper arm, subsequent encounter: Secondary | ICD-10-CM | POA: Insufficient documentation

## 2019-11-03 DIAGNOSIS — W208XXD Other cause of strike by thrown, projected or falling object, subsequent encounter: Secondary | ICD-10-CM | POA: Insufficient documentation

## 2019-11-03 DIAGNOSIS — F17200 Nicotine dependence, unspecified, uncomplicated: Secondary | ICD-10-CM | POA: Diagnosis not present

## 2019-11-03 MED ORDER — OXYCODONE HCL 10 MG PO TABS: 10.0000 mg | ORAL_TABLET | Freq: Four times a day (QID) | ORAL | 0 refills | Status: AC | PRN

## 2019-11-03 NOTE — ED Provider Notes (Signed)
Medical screening examination/treatment/procedure(s) were conducted as a shared visit with non-physician practitioner(s) and myself.  I personally evaluated the patient during the encounter. Briefly, the patient is a 59 y.o. male presents the ED with right arm pain after injury several days ago.  Has bruising to the right upper arm.  Neurovascularly neuromuscularly intact.  Continues to have some severe bruising but compartments are soft in the right upper arm.  X-rays were unremarkable several days ago.  Overall believe ongoing soft tissue contusion.  Stating that pain medication is not helping much and making it difficult for him to sleep.  Will prescribe narcotic pain medicine for breakthrough pain but recommend continued use of Tylenol, Motrin, ice, muscle relaxant and sling.  He has follow-up with orthopedics in place.  Discharged in good condition.  This chart was dictated using voice recognition software.  Despite best efforts to proofread,  errors can occur which can change the documentation meaning.     EKG Interpretation None           Virgina Norfolk, DO 11/03/19 1430

## 2019-11-03 NOTE — Discharge Instructions (Addendum)
Please follow-up with your orthopedist, Dr. Susa Simmonds, as scheduled.  I have prescribed you 10 mg oxycodone that you can take as needed for pain control. You were given narcotic: Do not drive. Do not use machinery or power tools. Do not sign legal documents. Do not drink alcohol. Do not take sleeping pills. Do not supervise children by yourself. Do not participate in activities that require climbing or being in high places.  You may also continue with the previously prescribed naproxen.  Be careful combining the oxycodone with Robaxin as that can make you particularly drowsy and sedated.  I would advise against it.    Return to the ED or see me medical attention should you develop any new or worsening symptoms.  This includes worsening swelling, worsening pain, inability to wiggle your fingers or flex your wrist, numbness or weakness, cold hand, or other symptoms.

## 2019-11-03 NOTE — ED Triage Notes (Signed)
Pt seen 2 nights ago after the car fell on him while he was changing his tire. He states there was no damage shown on imaging previously, but reports the pain has worsened and he is having trouble sleeping. Swelling and bruising noted to right arm.

## 2019-11-03 NOTE — ED Provider Notes (Signed)
[facility]-EMERGENCY DEPT Provider Note   CSN: [id number] Arrival date & time: 11/03/19  1312     History No chief complaint on file.   Derek Reynolds. is a 59 y.o. male who returns to the ED with complaints of worsening swelling and pain.  I reviewed patient's medical record and he was evaluated yesterday, 11/02/2019, after he was changing a tire and the car jack collapsed, striking him in the shoulder and causing him to land on his right elbow.  He was advised to follow-up with Dr. Susa Simmonds, orthopedics.    Patient is right-hand dominant.  Yesterday, he was noted to be neurovascularly intact and x-rays of right elbow and shoulder were without any appreciable fractures or other acute osseous abnormalities.  However, concern for ligamentous or tendinous injury.  He was placed in a sling and provided naproxen and Robaxin for pain in addition to orthopedic follow-up.  He was also advised to maintain motion to avoid adhesive capsulitis.  Today on my exam, patient states that he is having anterior right shoulder pain in addition to pain over his olecranon bursa and brachial region of upper arm.  He states that his range of motion is limited due to the inflammation and swelling.  He denies any true weakness.  He also denies any numbness or tingling.  No cold hands.  He is concerned about the worsening pain symptoms despite naproxen and muscle relaxants.  Patient drove here today and would prefer not to be treated with any strong analgesics here, but is hoping that he can have some at home to help him sleep at night.    HPI     No past medical history on file.  There are no problems to display for this patient.   No past surgical history on file.     No family history on file.  Social History   Tobacco Use  . Smoking status: Current Some Day Smoker  . Smokeless tobacco: Never Used  Vaping Use  . Vaping Use: Never used  Substance Use Topics  . Alcohol use:  No  . Drug use: No    Home Medications Prior to Admission medications   Medication Sig Start Date End Date Taking? Authorizing Provider  methocarbamol (ROBAXIN) 500 MG tablet Take 1 tablet (500 mg total) by mouth every 8 (eight) hours as needed for muscle spasms. 11/02/19   Petrucelli, Samantha R, PA-C  naproxen (NAPROSYN) 500 MG tablet Take 1 tablet (500 mg total) by mouth 2 (two) times daily as needed for moderate pain. 11/02/19   Petrucelli, Samantha R, PA-C  oxyCODONE 10 MG TABS Take 1 tablet (10 mg total) by mouth every 6 (six) hours as needed for up to 10 doses for severe pain. 11/03/19   Lorelee New, PA-C    Allergies    Patient has no known allergies.  Review of Systems   Review of Systems  Constitutional: Negative for fever.  Musculoskeletal: Positive for arthralgias and myalgias.  Skin: Positive for color change.  Neurological: Negative for weakness and numbness.    Physical Exam Updated Vital Signs BP (!) 158/70 (BP Location: Left Arm)   Pulse 80   Temp 98.5 F (36.9 C) (Oral)   Resp 17   Ht 5\' 8"  (1.727 m)   Wt 83.9 kg   SpO2 99%   BMI 28.13 kg/m   Physical Exam Vitals and nursing note reviewed. Exam conducted with a chaperone present.  Constitutional:  Appearance: Normal appearance.  HENT:     Head: Normocephalic and atraumatic.  Eyes:     General: No scleral icterus.    Conjunctiva/sclera: Conjunctivae normal.  Pulmonary:     Effort: Pulmonary effort is normal.  Musculoskeletal:     Comments: Right arm: ROM largely intact, mildly limited due to swelling and inflammation.  Significant ecchymoses and bruising over brachial artery region of upper arm.  Radial pulse intact and symmetric with contralateral arm.  Sensation intact throughout.  Functionally intact.  TTP over anterior aspect of shoulder.  Compartments are soft throughout.  Skin:    General: Skin is dry.  Neurological:     Mental Status: He is alert.     GCS: GCS eye subscore is 4.  GCS verbal subscore is 5. GCS motor subscore is 6.  Psychiatric:        Mood and Affect: Mood normal.        Behavior: Behavior normal.        Thought Content: Thought content normal.        ED Results / Procedures / Treatments   Labs (all labs ordered are listed, but only abnormal results are displayed) Labs Reviewed - No data to display  EKG None  Radiology DG Shoulder Right  Result Date: 11/02/2019 CLINICAL DATA:  Crush injury EXAM: RIGHT SHOULDER - 2+ VIEW COMPARISON:  None. FINDINGS: There is no evidence of fracture or dislocation. There is no evidence of arthropathy or other focal bone abnormality. Soft tissues are unremarkable. IMPRESSION: Negative. Electronically Signed   By: Deatra Robinson M.D.   On: 11/02/2019 01:13   DG Elbow Complete Right  Result Date: 11/02/2019 CLINICAL DATA:  Crush injury EXAM: RIGHT ELBOW - COMPLETE 3+ VIEW COMPARISON:  None. FINDINGS: There is no evidence of fracture, dislocation, or joint effusion. There is no evidence of arthropathy or other focal bone abnormality. Soft tissue swelling along the dorsum of the elbow. IMPRESSION: Negative. Electronically Signed   By: Deatra Robinson M.D.   On: 11/02/2019 01:18    Procedures Procedures (including critical care time)  Medications Ordered in ED Medications - No data to display  ED Course  I have reviewed the triage vital signs and the nursing notes.  Pertinent labs & imaging results that were available during my care of the patient were reviewed by me and considered in my medical decision making (see chart for details).    MDM Rules/Calculators/A&P                          Patient states that his primary complaint is that he is having difficulty falling asleep at night due to his pain symptoms.  He is requesting something stronger than the naproxen and Flexeril that had already been prescribed.  He has an appointment upcoming with Dr. Susa Simmonds with [facility].  Considered CT  angiogram given that there is significant bruising over his brachial artery, however patient is neurovascularly and functionally intact.  Lower suspicion for traumatic brachial artery occlusion/injury at this time.  Suspect that it is muscular.  Discussed case with Dr. Lockie Mola who personally evaluated patient and agrees with assessment and plan.  We will prescribe patient 10 mg Oxycodone to take at night as needed for pain control.  He will follow up with Dr. Susa Simmonds for ongoing evaluation and management.  Strict ED return precautions discussed.  Patient voices understanding and is agreeable.  Final Clinical Impression(s) / ED Diagnoses Final diagnoses:  Arm injury, right, subsequent encounter    Rx / DC Orders ED Discharge Orders         Ordered    oxyCODONE 10 MG TABS  Every 6 hours PRN        11/03/19 1515           Elvera Maria 11/03/19 1518    Virgina Norfolk, DO 11/03/19 1534

## 2019-11-22 ENCOUNTER — Other Ambulatory Visit: Payer: Self-pay | Admitting: Orthopaedic Surgery

## 2019-11-22 DIAGNOSIS — M25511 Pain in right shoulder: Secondary | ICD-10-CM

## 2019-12-02 ENCOUNTER — Ambulatory Visit (HOSPITAL_BASED_OUTPATIENT_CLINIC_OR_DEPARTMENT_OTHER): Payer: Worker's Compensation

## 2019-12-05 ENCOUNTER — Ambulatory Visit
Admission: RE | Admit: 2019-12-05 | Discharge: 2019-12-05 | Disposition: A | Discharge status: Home / Self Care | Payer: Self-pay | Source: Ambulatory Visit | Attending: Orthopaedic Surgery | Admitting: Orthopaedic Surgery

## 2019-12-05 ENCOUNTER — Other Ambulatory Visit: Payer: Self-pay

## 2019-12-05 DIAGNOSIS — M25511 Pain in right shoulder: Secondary | ICD-10-CM

## 2019-12-15 ENCOUNTER — Other Ambulatory Visit: Payer: No Typology Code available for payment source

## 2021-12-01 ENCOUNTER — Encounter (HOSPITAL_COMMUNITY): Payer: Self-pay | Admitting: *Deleted

## 2021-12-01 ENCOUNTER — Ambulatory Visit (HOSPITAL_COMMUNITY)
Admission: EM | Admit: 2021-12-01 | Discharge: 2021-12-01 | Disposition: A | Discharge status: Home / Self Care | Payer: Self-pay | Attending: Emergency Medicine | Admitting: Emergency Medicine

## 2021-12-01 DIAGNOSIS — S61211A Laceration without foreign body of left index finger without damage to nail, initial encounter: Secondary | ICD-10-CM

## 2021-12-01 MED ORDER — BACITRACIN ZINC 500 UNIT/GM EX OINT
TOPICAL_OINTMENT | CUTANEOUS | Status: AC
  Filled 2021-12-01: qty 0.9

## 2021-12-01 NOTE — ED Triage Notes (Signed)
Patient reports cut to left pointer finger, occurred yesterday. Laceration noted over middle distal joint. States he was working on car. Cleaned out wound with water and peroxide. Bleeding has stopped. Was able to bend on middle joint yesterday, due to swelling he is unable to bend at joint. Area is inflamed and swollen.   Tetanus up to date.

## 2021-12-01 NOTE — ED Notes (Addendum)
Education provided regarding blood pressure, patient has machine at home. Suggested patient start monitoring and follow up with a primary care provider.   Soaking hand in water and soap.

## 2021-12-01 NOTE — Discharge Instructions (Signed)
I recommend apply antibiotic ointment twice daily. Bacitracin is a good one to use. You can change dressing daily or as often as you prefer  Keep the finger clean and dry. Don't submerge in water until skin heals  Wear the splint for the next day or so to avoid bending the joint and tearing the skin.  Watch for signs of infection. Please return with any concerns.

## 2021-12-01 NOTE — ED Provider Notes (Signed)
[facility]    CSN: [id number] Arrival date & time: 12/01/21  1906      History   Chief Complaint Chief Complaint  Patient presents with   Laceration    HPI Derek Reynolds. is a 61 y.o. male.  Presents with laceration to left index finger Occurred yesterday when working on his car. Cut on saw Cleaned with water and peroxide after injury occurred  Bleeding has been controlled. Some swelling over the joint today compared to yesterday   Up to date on tetanus (last year or so)  History reviewed. No pertinent past medical history.  There are no problems to display for this patient.  History reviewed. No pertinent surgical history.   Home Medications    Prior to Admission medications   Medication Sig Start Date End Date Taking? Authorizing Provider  methocarbamol (ROBAXIN) 500 MG tablet Take 1 tablet (500 mg total) by mouth every 8 (eight) hours as needed for muscle spasms. 11/02/19   Petrucelli, Samantha R, PA-C  naproxen (NAPROSYN) 500 MG tablet Take 1 tablet (500 mg total) by mouth 2 (two) times daily as needed for moderate pain. 11/02/19   Petrucelli, Samantha R, PA-C  oxyCODONE 10 MG TABS Take 1 tablet (10 mg total) by mouth every 6 (six) hours as needed for up to 10 doses for severe pain. 11/03/19   Lorelee New, PA-C    Family History History reviewed. No pertinent family history.  Social History Social History   Tobacco Use   Smoking status: Some Days   Smokeless tobacco: Never  Vaping Use   Vaping Use: Never used  Substance Use Topics   Alcohol use: No   Drug use: No     Allergies   Patient has no known allergies.   Review of Systems Review of Systems As per HPI  Physical Exam Triage Vital Signs ED Triage Vitals  Enc Vitals Group     BP 12/01/21 1945 (!) 160/90     Pulse Rate 12/01/21 1945 74     Resp 12/01/21 1945 16     Temp 12/01/21 1945 98 F (36.7 C)     Temp Source 12/01/21 1945 Oral     SpO2 12/01/21 1945 100  %     Weight --      Height --      Head Circumference --      Peak Flow --      Pain Score 12/01/21 1943 5     Pain Loc --      Pain Edu? --      Excl. in GC? --    No data found.  Updated Vital Signs BP (!) 160/90 (BP Location: Right Arm)   Pulse 74   Temp 98 F (36.7 C) (Oral)   Resp 16   SpO2 100%    Physical Exam Vitals and nursing note reviewed.  Constitutional:      General: He is not in acute distress.    Appearance: He is not ill-appearing.  HENT:     Mouth/Throat:     Pharynx: Oropharynx is clear.  Cardiovascular:     Rate and Rhythm: Normal rate and regular rhythm.     Pulses: Normal pulses.  Pulmonary:     Effort: Pulmonary effort is normal.  Skin:    Capillary Refill: Capillary refill takes less than 2 seconds.     Findings: Wound present.     Comments: Lac over left index finger PIP joint. Not well approximated.  No bleeding. No foreign body noted  Neurological:     Mental Status: He is alert and oriented to person, place, and time.     Comments: Full ROM of finger joints. Distal sensation intact. Cap refill < 2 seconds     UC Treatments / Results  Labs (all labs ordered are listed, but only abnormal results are displayed) Labs Reviewed - No data to display  EKG  Radiology No results found.  Procedures Laceration Repair  Date/Time: 12/01/2021 8:25 PM  Performed by: Marlow Baars, PA-C Authorized by: Marlow Baars, PA-C   Consent:    Consent obtained:  Verbal   Consent given by:  Patient   Risks, benefits, and alternatives were discussed: yes   Universal protocol:    Patient identity confirmed:  Verbally with patient Anesthesia:    Anesthesia method:  None Laceration details:    Location:  Finger   Finger location:  L index finger   Length (cm):  3 Exploration:    Wound exploration: wound explored through full range of motion and entire depth of wound visualized     Wound extent: no nerve damage noted and no tendon damage noted    Treatment:    Area cleansed with:  Chlorhexidine and Shur-Clens   Amount of cleaning:  Standard   Irrigation solution:  Sterile saline Skin repair:    Repair method:  Steri-Strips   Number of Steri-Strips:  2 Approximation:    Approximation:  Loose Repair type:    Repair type:  Simple Post-procedure details:    Dressing:  Antibiotic ointment, splint for protection and non-adherent dressing   Procedure completion:  Tolerated   Medications Ordered in UC Medications - No data to display  Initial Impression / Assessment and Plan / UC Course  I have reviewed the triage vital signs and the nursing notes.  Pertinent labs & imaging results that were available during my care of the patient were reviewed by me and considered in my medical decision making (see chart for details).  Defer xray given full ROM at joints and mechanism of injury Soaked and cleaned in clinic Steri strips loose closure. Splinted for protection.  Applied abx ointment. Recommend apply twice daily Use ibuprofen/tylenol for pain control, can use ice for swelling UTD on tetanus so defer today. Discussed monitoring for signs of infection. Return precautions discussed. Patient agrees to plan  Final Clinical Impressions(s) / UC Diagnoses   Final diagnoses:  Laceration of left index finger without foreign body without damage to nail, initial encounter     Discharge Instructions      I recommend apply antibiotic ointment twice daily. Bacitracin is a good one to use. You can change dressing daily or as often as you prefer  Keep the finger clean and dry. Don't submerge in water until skin heals  Wear the splint for the next day or so to avoid bending the joint and tearing the skin.  Watch for signs of infection. Please return with any concerns.      ED Prescriptions   None    PDMP not reviewed this encounter.   Hatim Homann, Ray Church 12/01/21 2027

## 2021-12-01 NOTE — ED Notes (Signed)
Signs and symptoms of infection reviewed with patient. Patient provided supplies for dressing wound at home.

## 2022-04-07 ENCOUNTER — Other Ambulatory Visit: Payer: Self-pay

## 2022-04-07 ENCOUNTER — Encounter (HOSPITAL_COMMUNITY): Payer: Self-pay

## 2022-04-07 ENCOUNTER — Emergency Department (HOSPITAL_COMMUNITY)
Admission: EM | Admit: 2022-04-07 | Discharge: 2022-04-08 | Disposition: A | Discharge status: Home / Self Care | Payer: 59 | Attending: Emergency Medicine | Admitting: Emergency Medicine

## 2022-04-07 DIAGNOSIS — M545 Low back pain, unspecified: Secondary | ICD-10-CM | POA: Insufficient documentation

## 2022-04-07 NOTE — ED Triage Notes (Signed)
I was working on my vehicle overhead and not wearing my back brace.

## 2022-04-08 MED ORDER — METHOCARBAMOL 500 MG PO TABS: 500.0000 mg | ORAL_TABLET | Freq: Two times a day (BID) | ORAL | 0 refills | Status: AC

## 2022-04-08 MED ORDER — LIDOCAINE 5 % EX PTCH
2.0000 | MEDICATED_PATCH | CUTANEOUS | Status: DC
  Administered 2022-04-08: 2 via TRANSDERMAL
  Filled 2022-04-08: qty 2

## 2022-04-08 MED ORDER — PREDNISONE 10 MG PO TABS: 40.0000 mg | ORAL_TABLET | Freq: Every day | ORAL | 0 refills | Status: AC

## 2022-04-08 MED ORDER — LIDOCAINE 5 % EX PTCH: 1.0000 | MEDICATED_PATCH | CUTANEOUS | 0 refills | Status: AC

## 2022-04-08 NOTE — ED Provider Notes (Signed)
[facility] Provider Note   CSN: [id number] Arrival date & time: 04/07/22  2320     History  Chief Complaint  Patient presents with   Back Pain    Derek Reynolds. is a 62 y.o. male.   Back Pain Patient is a 62 year old male with no medical problems  He presents emergency room today with complaints of left-sided low back pain began on Thursday when he was doing some overhead work including using a drill above his head.  He states that the next day his back was more achy than before he states that Friday and Saturday he had episodic back pain where he would "lock up "       Home Medications Prior to Admission medications   Medication Sig Start Date End Date Taking? Authorizing Provider  lidocaine (LIDODERM) 5 % Place 1 patch onto the skin daily. Remove & Discard patch within 12 hours or as directed by MD 04/08/22  Yes Zyad Boomer, Kathleene Hazel, PA  methocarbamol (ROBAXIN) 500 MG tablet Take 1 tablet (500 mg total) by mouth 2 (two) times daily. 04/08/22  Yes Layza Summa S, PA  predniSONE (DELTASONE) 10 MG tablet Take 4 tablets (40 mg total) by mouth daily. 04/08/22  Yes Cheral Cappucci S, PA  naproxen (NAPROSYN) 500 MG tablet Take 1 tablet (500 mg total) by mouth 2 (two) times daily as needed for moderate pain. 11/02/19   Petrucelli, Samantha R, PA-C  oxyCODONE 10 MG TABS Take 1 tablet (10 mg total) by mouth every 6 (six) hours as needed for up to 10 doses for severe pain. 11/03/19   Corena Herter, PA-C      Allergies    Patient has no known allergies.    Review of Systems   Review of Systems  Musculoskeletal:  Positive for back pain.    Physical Exam Updated Vital Signs BP 135/83   Pulse 78   Temp 98.4 F (36.9 C) (Oral)   Resp (!) 24   Ht 5\' 8"  (1.727 m)   Wt 81.6 kg   SpO2 99%   BMI 27.37 kg/m  Physical Exam Vitals and nursing note reviewed.  Constitutional:      General: He is not in acute distress. HENT:     Head:  Normocephalic and atraumatic.     Nose: Nose normal.     Mouth/Throat:     Mouth: Mucous membranes are moist.  Eyes:     General: No scleral icterus. Cardiovascular:     Rate and Rhythm: Normal rate and regular rhythm.     Pulses: Normal pulses.     Heart sounds: Normal heart sounds.  Pulmonary:     Effort: Pulmonary effort is normal. No respiratory distress.     Breath sounds: No wheezing.  Abdominal:     Palpations: Abdomen is soft.     Tenderness: There is no abdominal tenderness. There is no guarding or rebound.  Musculoskeletal:     Cervical back: Normal range of motion.     Right lower leg: No edema.     Left lower leg: No edema.     Comments: Para-vertebral muscular tenderness on left side.  No CTL spine TTP  BL brisk patellar reflexes  Sensation intact BL lower extremities  Skin:    General: Skin is warm and dry.     Capillary Refill: Capillary refill takes less than 2 seconds.  Neurological:     Mental Status: He is alert.  Mental status is at baseline.  Psychiatric:        Mood and Affect: Mood normal.        Behavior: Behavior normal.     ED Results / Procedures / Treatments   Labs (all labs ordered are listed, but only abnormal results are displayed) Labs Reviewed - No data to display  EKG None  Radiology No results found.  Procedures Procedures    Medications Ordered in ED Medications  lidocaine (LIDODERM) 5 % 2 patch (has no administration in time range)    ED Course/ Medical Decision Making/ A&P                             Medical Decision Making Risk Prescription drug management.   Patient is a 62 year old male with no medical problems  He presents emergency room today with complaints of left-sided low back pain began on Thursday when he was doing some overhead work including using a drill above his head.  He states that the next day his back was more achy than before he states that Friday and Saturday he had episodic back pain where  he would "lock up "   Broad differential for back pain considered includes malignancy, disc herniation, spinal epidural abscess, spinal fracture, cauda equina, pyelonephritis, kidney stone, AAA, AD, pancreatitis, PE and PTX.   History without symptoms of urinary or stool retention or incontinence, neurologic changes such as sensation change or weakness lower extremities, coagulopathy or blood thinner use, is not elderly or with history of osteoporosis, denies any history of cancer, fever, IV drug use, weight changes (unexplained), or prolonged steroid use.   Physical exam most consistent with muscular strain. Doubt cauda equina or disc herniation d/t lack of saddle anesthesia/bowel or bladder incontinence or urinary retention, normal gait and reassuring physical examination without neurologic deficits.   History is not supportive of kidney stone, AAA, AD, pancreatitis, PE or PTX. Patient has no CVA tenderness or urinary sx to suggest pyelonephritis or kidney stone.   Will manage patient conservatively at this time. NSAIDs, back exercises/stretches, heat therapy and follow up with PCP if symptoms do not resolve in 3-4 weeks. Patient offered muscle relaxer for comfort at night. Counseled on need to return to ED for fever, worsening or concerning symptoms. Patient agreeable to plan and states understanding of follow up plans and return precautions.      Vitals WNL at time of discharge and patient is no acute distress   VS normal.   Lidoderm patch, robaxin  Prednisone for pain if consistent 5 days from now.   Ambulatory at time of discharge.  Patient agreeable to plan.  Final Clinical Impression(s) / ED Diagnoses Final diagnoses:  Acute left-sided low back pain without sciatica    Rx / DC Orders ED Discharge Orders          Ordered    lidocaine (LIDODERM) 5 %  Every 24 hours        04/08/22 0226    predniSONE (DELTASONE) 10 MG tablet  Daily        04/08/22 0226    methocarbamol  (ROBAXIN) 500 MG tablet  2 times daily        04/08/22 0226              Tedd Sias, PA 04/08/22 0422    Orpah Greek, MD 04/08/22 [phone]

## 2022-04-08 NOTE — Discharge Instructions (Addendum)
Back Pain:  Back pain is very common.  The pain often gets better over time.  The cause of back pain is usually not dangerous.  Most people can learn to manage their back pain on their own.  However if you develop severe or worsening pain, low back pain with fever, numbness, weakness or inability to walk or urinate/stool, you should return to the ER immediately.  Please follow up with your doctor this week for a recheck if still having symptoms.  Low back pain is discomfort in the lower back that may be due to injuries to muscles and ligaments around the spine.  Occasionally, it may be caused by a a problem to a part of the spine called a disc. The pain may last several days or a week;  However, most patients get completely well in 4 weeks.  Medications are also useful to help with pain control.  A commonly prescribed medications includes acetaminophen.  This medication is generally safe, though you should not take more than 8 of the extra strength (500mg ) pills a day.  Non steroidal anti inflammatory medications including Ibuprofen and naproxen;  These medications help both pain and swelling and are very useful in treating back pain.  They should be taken with food, as they can cause stomach upset, and more seriously, stomach bleeding.    Be aware that if you develop new symptoms, such as a fever, leg weakness, difficulty with or loss of control of your urine or bowels, abdominal pain, or more severe pain, you will need to seek medical attention and  / or return to the Emergency department.    Home Care Stay active.  Start with short walks on flat ground if you can.  Try to walk farther each day. Do not sit, drive or stand in one place for more than 30 minutes.  Do not stay in bed. Do not avoid exercise or work.  Activity can help your back heal faster. Be careful when you bend or lift an object.  Bend at your knees, keep the object close to you, and do not twist. Sleep on a firm mattress.  Lie  on your side, and bend your knees.  If you lie on your back, put a pillow under your knees. Only take medicines as told by your doctor. Put ice on the injured area. Put ice in a plastic bag Place a towel between your skin and the bag Leave the ice on for 15-20 minutes, 3-4 times a day for the first 2-3 days. 210 After that, you can switch between ice and heat packs. Ask your doctor about back exercises or massage. Avoid feeling anxious or stressed.  Find good ways to deal with stress, such as exercise.  Please use Tylenol or ibuprofen for pain.  You may use 600 mg ibuprofen every 6 hours or 1000 mg of Tylenol every 6 hours.  You may choose to alternate between the 2.  This would be most effective.  Not to exceed 4 g of Tylenol within 24 hours.  Not to exceed 3200 mg ibuprofen 24 hours. Robaxin as well -- please take prednisone if your symptoms are NOT at all improved in 5 days.    Get Help Right Way If: Your pain does not go away with rest or medicine. Your pain does not go away in 1 week. You have new problems. You do not feel well. The pain spreads into your legs. You cannot control when you poop (bowel movement) or pee (  urinate) You feel sick to your stomach (nauseous) or throw up (vomit) You have belly (abdominal) pain. You feel like you may pass out (faint). If you develop a fever.  Make Sure you: Understand these instructions. Watch your condition Get help right away if you are not doing well or get worse.

## 2023-05-02 ENCOUNTER — Emergency Department (HOSPITAL_COMMUNITY)

## 2023-05-02 ENCOUNTER — Emergency Department (HOSPITAL_COMMUNITY)
Admission: EM | Admit: 2023-05-02 | Discharge: 2023-05-02 | Disposition: A | Discharge status: Home / Self Care | Attending: Emergency Medicine | Admitting: Emergency Medicine

## 2023-05-02 ENCOUNTER — Other Ambulatory Visit: Payer: Self-pay

## 2023-05-02 ENCOUNTER — Encounter (HOSPITAL_COMMUNITY): Payer: Self-pay | Admitting: Emergency Medicine

## 2023-05-02 DIAGNOSIS — R030 Elevated blood-pressure reading, without diagnosis of hypertension: Secondary | ICD-10-CM | POA: Insufficient documentation

## 2023-05-02 DIAGNOSIS — R519 Headache, unspecified: Secondary | ICD-10-CM | POA: Insufficient documentation

## 2023-05-02 LAB — COMPREHENSIVE METABOLIC PANEL WITH GFR
ALT: 12 U/L (ref 0–44)
AST: 20 U/L (ref 15–41)
Albumin: 3.5 g/dL (ref 3.5–5.0)
Alkaline Phosphatase: 49 U/L (ref 38–126)
Anion gap: 8 (ref 5–15)
BUN: 10 mg/dL (ref 8–23)
CO2: 26 mmol/L (ref 22–32)
Calcium: 8.5 mg/dL — ABNORMAL LOW (ref 8.9–10.3)
Chloride: 105 mmol/L (ref 98–111)
Creatinine, Ser: 1.05 mg/dL (ref 0.61–1.24)
GFR, Estimated: >60 mL/min (ref >60)
Glucose, Bld: 105 mg/dL — ABNORMAL HIGH (ref 70–99)
Potassium: 4.3 mmol/L (ref 3.5–5.1)
Sodium: 139 mmol/L (ref 135–145)
Total Bilirubin: 0.8 mg/dL (ref 0.0–1.2)
Total Protein: 6.2 g/dL — ABNORMAL LOW (ref 6.5–8.1)

## 2023-05-02 LAB — URINALYSIS, ROUTINE W REFLEX MICROSCOPIC
Bacteria, UA: NONE SEEN
Bilirubin Urine: NEGATIVE
Glucose, UA: NEGATIVE mg/dL
Hgb urine dipstick: NEGATIVE
Ketones, ur: NEGATIVE mg/dL
Nitrite: NEGATIVE
Protein, ur: NEGATIVE mg/dL
Specific Gravity, Urine: 1.02 (ref 1.005–1.030)
pH: 5 (ref 5.0–8.0)

## 2023-05-02 LAB — CBC
HCT: 39.2 % (ref 39.0–52.0)
Hemoglobin: 12.5 g/dL — ABNORMAL LOW (ref 13.0–17.0)
MCH: 24 pg — ABNORMAL LOW (ref 26.0–34.0)
MCHC: 31.9 g/dL (ref 30.0–36.0)
MCV: 75.2 fL — ABNORMAL LOW (ref 80.0–100.0)
Platelets: 152 10*3/uL (ref 150–400)
RBC: 5.21 MIL/uL (ref 4.22–5.81)
RDW: 16.1 % — ABNORMAL HIGH (ref 11.5–15.5)
WBC: 6.7 10*3/uL (ref 4.0–10.5)
nRBC: 0 % (ref 0.0–0.2)

## 2023-05-02 LAB — CBG MONITORING, ED: Glucose-Capillary: 89 mg/dL (ref 70–99)

## 2023-05-02 NOTE — ED Notes (Signed)
 Pt being transported to CT

## 2023-05-02 NOTE — ED Provider Notes (Signed)
 [facility] Provider Note   CSN: [id number] Arrival date & time: 05/02/23  8295     History  Chief Complaint  Patient presents with   Headache    Derek Reynolds. is a 63 y.o. male with hx of sciatica who presents to the ER complaining of headache x 2 days. States it is not severe now, but persistent. He is concerned about his blood sugar or dehydration. States he does not feel that he has been drinking enough water.  He took 2 Tylenol  last night and went to bed, but woke up and his headache was still there.  He feels as though his vision has been getting blurry over the last month, but has not been to the eye doctor in quite some time and thinks that he needs to go.  He denies any chest pain, shortness of breath, fever, chills, cough, sore throat, ear pain, numbness or tingling in the extremities, weakness or dizziness. He is tolerating oral fluids.    Headache      Home Medications Prior to Admission medications   Medication Sig Start Date End Date Taking? Authorizing Provider  lidocaine  (LIDODERM ) 5 % Place 1 patch onto the skin daily. Remove & Discard patch within 12 hours or as directed by MD 04/08/22   Coretta Dexter, PA  methocarbamol  (ROBAXIN ) 500 MG tablet Take 1 tablet (500 mg total) by mouth 2 (two) times daily. 04/08/22   Coretta Dexter, PA  naproxen  (NAPROSYN ) 500 MG tablet Take 1 tablet (500 mg total) by mouth 2 (two) times daily as needed for moderate pain. 11/02/19   Petrucelli, Samantha R, PA-C  oxyCODONE  10 MG TABS Take 1 tablet (10 mg total) by mouth every 6 (six) hours as needed for up to 10 doses for severe pain. 11/03/19   Jhonnie Mosher, PA-C  predniSONE  (DELTASONE ) 10 MG tablet Take 4 tablets (40 mg total) by mouth daily. 04/08/22   Coretta Dexter, PA      Allergies    Patient has no known allergies.    Review of Systems   Review of Systems  Neurological:  Positive for headaches.  All other systems  reviewed and are negative.   Physical Exam Updated Vital Signs BP (!) 186/102 (BP Location: Right Arm)   Pulse 63   Temp 98.1 F (36.7 C)   Resp 16   Ht 5\' 8"  (1.727 m)   Wt 79.4 kg   SpO2 100%   BMI 26.61 kg/m  Physical Exam Vitals and nursing note reviewed.  Constitutional:      Appearance: Normal appearance.  HENT:     Head: Normocephalic and atraumatic.  Eyes:     Conjunctiva/sclera: Conjunctivae normal.  Cardiovascular:     Rate and Rhythm: Normal rate and regular rhythm.  Pulmonary:     Effort: Pulmonary effort is normal. No respiratory distress.     Breath sounds: Normal breath sounds.  Abdominal:     General: There is no distension.     Palpations: Abdomen is soft.     Tenderness: There is no abdominal tenderness.  Skin:    General: Skin is warm and dry.  Neurological:     General: No focal deficit present.     Mental Status: He is alert.     Comments: Neuro: Speech is clear, able to follow commands. CN III-XII intact grossly intact. PERRLA. EOMI. Sensation intact throughout. Str 5/5 all extremities.     ED  Results / Procedures / Treatments   Labs (all labs ordered are listed, but only abnormal results are displayed) Labs Reviewed  COMPREHENSIVE METABOLIC PANEL WITH GFR - Abnormal; Notable for the following components:      Result Value   Glucose, Bld 105 (*)    Calcium 8.5 (*)    Total Protein 6.2 (*)    All other components within normal limits  CBC - Abnormal; Notable for the following components:   Hemoglobin 12.5 (*)    MCV 75.2 (*)    MCH 24.0 (*)    RDW 16.1 (*)    All other components within normal limits  URINALYSIS, ROUTINE W REFLEX MICROSCOPIC - Abnormal; Notable for the following components:   Leukocytes,Ua TRACE (*)    All other components within normal limits  CBG MONITORING, ED    EKG None  Radiology CT Head Wo Contrast Result Date: 05/02/2023 CLINICAL DATA:  Headache, new onset EXAM: CT HEAD WITHOUT CONTRAST TECHNIQUE:  Contiguous axial images were obtained from the base of the skull through the vertex without intravenous contrast. RADIATION DOSE REDUCTION: This exam was performed according to the departmental dose-optimization program which includes automated exposure control, adjustment of the mA and/or kV according to patient size and/or use of iterative reconstruction technique. COMPARISON:  None Available. FINDINGS: Brain: No evidence of acute infarction, hemorrhage, hydrocephalus, extra-axial collection or mass lesion/mass effect. Vascular: No hyperdense vessel or unexpected calcification. Skull: Normal. Negative for fracture or focal lesion. Sinuses/Orbits: No acute finding. IMPRESSION: No acute finding or explanation for headache. Electronically Signed   By: Ronnette Coke M.D.   On: 05/02/2023 09:01    Procedures Procedures    Medications Ordered in ED Medications - No data to display  ED Course/ Medical Decision Making/ A&P                                 Medical Decision Making Amount and/or Complexity of Data Reviewed Labs: ordered.  This patient is a 63 y.o. male  who presents to the ED for concern of headache.   Differential diagnoses prior to evaluation: The emergent differential diagnosis includes, but is not limited to,  Stroke, increased ICP, meningitis, CVA, intracranial tumor, venous sinus thrombosis, migraine, cluster headache, hypertension, drug related, head injury, tension headache, sinusitis, dental abscess, otitis media, TMJ, temporal arteritis, glaucoma, trigeminal neuralgia. This is not an exhaustive differential.   Past Medical History / Co-morbidities / Social History: sciatica  Additional history: Chart reviewed. Pertinent results include: No documented hx of diabetes  Physical Exam: Physical exam performed. The pertinent findings include: Hypertensive, otherwise normal vital signs.  No acute distress.  Heart regular rate and rhythm, lungs clear.  Normal neurologic  exam.  Lab Tests/Imaging studies: I personally interpreted labs/imaging and the pertinent results include: Normal CBG.  CBC and CMP unremarkable.  UA normal.  CT head without acute abnormalities.  I agree with the radiologist interpretation.  Disposition: After consideration of the diagnostic results and the patients response to treatment, I feel that emergency department workup does not suggest an emergent condition requiring admission or immediate intervention beyond what has been performed at this time. The plan is: Discharged home.  On reevaluation patient states that he is feeling somewhat better.  I discussed his elevated blood pressure, and how this could be contributing to his headaches.  No evidence of endorgan dysfunction or concern for hypertensive urgency/emergency at this time.  Will give  him instructions for PCP follow-up as he does not currently have a primary doctor.  He also plans to follow-up with an eye doctor regarding his chronic vision changes.  Recommended over-the-counter medications as needed for pain.  Given very strict return precautions.. The patient is safe for discharge and has been instructed to return immediately for worsening symptoms, change in symptoms or any other concerns.  Final Clinical Impression(s) / ED Diagnoses Final diagnoses:  Acute nonintractable headache, unspecified headache type  Elevated blood pressure reading    Rx / DC Orders ED Discharge Orders     None      Portions of this report may have been transcribed using voice recognition software. Every effort was made to ensure accuracy; however, inadvertent computerized transcription errors may be present.    Shekinah Pitones T, PA-C 05/02/23 5409    Iva Mariner, MD 05/02/23 [phone]

## 2023-05-02 NOTE — ED Triage Notes (Signed)
 Patient coming to ED for evaluation of headache x 2 days.  Reports "I have to think about it, but it is still there."  No reports of fevers.  Reports mild visual changes.  Concerned that "my blood sugar may be high or maybe I am dehydrated."  No dx of DM

## 2023-05-02 NOTE — Discharge Instructions (Signed)
 You were seen in the emergency department today for headache.  As we discussed your blood work and imaging looked normal today.  We did not find a cause for your headache.  Your blood pressure was somewhat elevated, and this sometimes can contribute to headaches.  I recommend following up with a primary doctor to evaluate your blood pressure, as well as seeing an eye doctor to evaluate the changes in your vision.  Continue monitor how you are doing and return to the ER for new or worsening symptoms.
